# Patient Record
Sex: Female | Born: 1950 | Race: White | Hispanic: No | Smoking: Never smoker
Health system: Southern US, Community
[De-identification: ages and names within clinical notes are randomized; demographics above are authoritative.]

## PROBLEM LIST (undated history)

## (undated) DIAGNOSIS — E039 Hypothyroidism, unspecified: Secondary | ICD-10-CM

## (undated) DIAGNOSIS — J45998 Other asthma: Secondary | ICD-10-CM

## (undated) DIAGNOSIS — M1990 Unspecified osteoarthritis, unspecified site: Secondary | ICD-10-CM

## (undated) DIAGNOSIS — J302 Other seasonal allergic rhinitis: Secondary | ICD-10-CM

## (undated) HISTORY — PX: TONSILLECTOMY: SUR1361

## (undated) HISTORY — PX: DILATION AND CURETTAGE OF UTERUS: SHX78

## (undated) HISTORY — PX: TUBAL LIGATION: SHX77

## (undated) HISTORY — PX: WRIST SURGERY: SHX841

## (undated) HISTORY — PX: MENISCECTOMY: SHX123

---

## 2018-07-24 ENCOUNTER — Encounter (HOSPITAL_COMMUNITY): Payer: Self-pay | Admitting: Student

## 2018-07-24 NOTE — H&P (Signed)
TOTAL HIP ADMISSION H&P  Patient is admitted for right total hip arthroplasty.  Subjective:  Chief Complaint: right hip pain  HPI: Hailey Holmes, 67 y.o. female, has a history of pain and functional disability in the right hip(s) due to arthritis and patient has failed non-surgical conservative treatments for greater than 12 weeks to include corticosteriod injections and activity modification.  Onset of symptoms was gradual starting several years ago with rapidly worsening course since that time due to a fall in February.The patient noted no past surgery on the right hip(s).  Patient currently rates pain in the right hip at 9 out of 10 with activity. Patient has night pain, worsening of pain with activity and weight bearing and limited range of motion. Patient has evidence of bone-on-bone arthritis of the right hip with errosion of the femoral head by imaging studies. This condition presents safety issues increasing the risk of falls. There is no current active infection.  There are no active problems to display for this patient.  History reviewed. No pertinent past medical history.  History reviewed. No pertinent surgical history.  No current facility-administered medications for this encounter.    No current outpatient medications on file.   Allergies not on file  Social History   Tobacco Use  . Smoking status: Not on file  Substance Use Topics  . Alcohol use: Not on file    History reviewed. No pertinent family history.   Review of Systems  Constitutional: Negative for chills and fever.  HENT: Negative for congestion, sore throat and tinnitus.   Eyes: Negative for double vision, photophobia and pain.  Respiratory: Negative for cough, shortness of breath and wheezing.   Cardiovascular: Negative for chest pain, palpitations and orthopnea.  Gastrointestinal: Negative for heartburn, nausea and vomiting.  Genitourinary: Negative for dysuria, frequency and urgency.  Musculoskeletal:  Positive for joint pain.  Neurological: Negative for dizziness, weakness and headaches.  Psychiatric/Behavioral: Negative for depression.    Objective:  Physical Exam  Well nourished and well developed. General: Alert and oriented x3, cooperative and pleasant, no acute distress. Head: normocephalic, atraumatic, neck supple. Eyes: EOMI. Respiratory: breath sounds clear in all fields, no wheezing, rales, or rhonchi. Cardiovascular: Regular rate and rhythm, no murmurs, gallops or rubs.  Abdomen: non-tender to palpation and soft, normoactive bowel sounds. Musculoskeletal: She has a severely antalgic gait pattern and requires 2 walking sticks to ambulate.  Right Hip Exam: ROM: Flexion to 100, Internal Rotation is minimal, External Rotation 30, and abduction 30 with discomfort. There is no tenderness over the greater trochanter bursa. Left Hip Exam: ROM: normal without discomfort. There is no tenderness over the greater trochanter bursa.  Right Knee Exam: No effusion. No Swelling.Range of motion is 0-130 degrees. Moderate crepitus on range of motion of the knee. Tenderness greater in the medial than lateral joint line tenderness. Stable knee. Left Knee Exam: No effusion. No Swelling.Range of motion is 0-125 degrees. No crepitus on range of motion of the knee. No medial joint line tendernessNo lateral joint line tenderness. Stable knee. Calves soft and nontender. Motor function intact in LE. Strength 5/5 LE bilaterally. Neuro: Distal pulses 2+. Sensation to light touch intact in LE.  Vital signs in last 24 hours: Blood pressure: 136/80 mmHg  Labs:   There is no height or weight on file to calculate BMI.   Imaging Review Plain radiographs demonstrate severe degenerative joint disease of the right hip(s). The bone quality appears to be adequate for age and reported activity level.  Preoperative templating of the joint replacement has been completed, documented, and submitted to the  Operating Room personnel in order to optimize intra-operative equipment management.     Assessment/Plan:  End stage arthritis, right hip(s)  The patient history, physical examination, clinical judgement of the provider and imaging studies are consistent with end stage degenerative joint disease of the right hip(s) and total hip arthroplasty is deemed medically necessary. The treatment options including medical management, injection therapy, arthroscopy and arthroplasty were discussed at length. The risks and benefits of total hip arthroplasty were presented and reviewed. The risks due to aseptic loosening, infection, stiffness, dislocation/subluxation,  thromboembolic complications and other imponderables were discussed.  The patient acknowledged the explanation, agreed to proceed with the plan and consent was signed. Patient is being admitted for inpatient treatment for surgery, pain control, PT, OT, prophylactic antibiotics, VTE prophylaxis, progressive ambulation and ADL's and discharge planning.The patient is planning to be discharged home with home health services.  Therapy Plans: HHPT Disposition: Home with friend Planned DVT Prophylaxis: Aspirin 325 mg BID DME needed: 3-n-1 PCP: Dr. Ninetta Lights TXA: IV Allergies: PCN (diarrhea), Codeine (decreased BP) Anesthesia concerns: nausea/vomiting Other: Pt states tramadol has not dropped BP in the past  - Patient was instructed on what medications to stop prior to surgery. - Follow-up visit in 2 weeks with Dr. Lequita Halt - Begin physical therapy following surgery - Pre-operative lab work as pre-surgical testing - Prescriptions will be provided in hospital at time of discharge  Arther Abbott, PA-C Orthopedic Surgery [facility] [place]

## 2018-07-31 ENCOUNTER — Other Ambulatory Visit: Payer: Self-pay

## 2018-07-31 ENCOUNTER — Encounter (HOSPITAL_COMMUNITY)
Admission: RE | Admit: 2018-07-31 | Discharge: 2018-07-31 | Disposition: A | Discharge status: Home / Self Care | Payer: Medicare Other | Source: Ambulatory Visit | Attending: Orthopedic Surgery | Admitting: Orthopedic Surgery

## 2018-07-31 ENCOUNTER — Encounter (HOSPITAL_COMMUNITY): Payer: Self-pay | Admitting: Emergency Medicine

## 2018-07-31 DIAGNOSIS — Z01812 Encounter for preprocedural laboratory examination: Secondary | ICD-10-CM | POA: Diagnosis present

## 2018-07-31 HISTORY — DX: Other seasonal allergic rhinitis: J30.2

## 2018-07-31 HISTORY — DX: Hypothyroidism, unspecified: E03.9

## 2018-07-31 HISTORY — DX: Unspecified osteoarthritis, unspecified site: M19.90

## 2018-07-31 HISTORY — DX: Other asthma: J45.998

## 2018-07-31 LAB — SURGICAL PCR SCREEN
MRSA, PCR: NEGATIVE
Staphylococcus aureus: NEGATIVE

## 2018-07-31 LAB — APTT: aPTT: 27 seconds (ref 24–36)

## 2018-07-31 LAB — PROTIME-INR
INR: 0.88
Prothrombin Time: 11.8 seconds (ref 11.4–15.2)

## 2018-07-31 NOTE — Progress Notes (Signed)
Chart left with tamika RN to f/u

## 2018-07-31 NOTE — Progress Notes (Signed)
Clearance Dr Ninetta LightsSara Furr 07-20-18 on chart

## 2018-07-31 NOTE — Patient Instructions (Signed)
Hailey Holmes  07/31/2018   Your procedure is scheduled on: 08-13-18   Report to [facility] Main  Entrance    Report to admitting at 12:20PM    Call this number if you have problems the morning of surgery [phone]     Remember: Do not eat food After Midnight. YOU MAY HAVE CLEAR LIQUIDS FROM MIDNIGHT UNTIL 8:30AM. NOTHING BY MOUTH AFTER 8:30AM! BRUSH YOUR TEETH MORNING OF SURGERY AND RINSE YOUR MOUTH OUT, NO CHEWING GUM CANDY OR MINTS.      CLEAR LIQUID DIET   Foods Allowed                                                                     Foods Excluded  Coffee and tea, regular and decaf                             liquids that you cannot  Plain Jell-O in any flavor                                             see through such as: Fruit ices (not with fruit pulp)                                     milk, soups, orange juice  Iced Popsicles                                    All solid food Carbonated beverages, regular and diet                                    Cranberry, grape and apple juices Sports drinks like Gatorade Lightly seasoned clear broth or consume(fat free) Sugar, honey syrup  Sample Menu Breakfast                                Lunch                                     Supper Cranberry juice                    Beef broth                            Chicken broth Jell-O                                     Grape juice  Apple juice Coffee or tea                        Jell-O                                      Popsicle                                                Coffee or tea                        Coffee or tea  _____________________________________________________________________       Take these medicines the morning of surgery with A SIP OF WATER: ALLEGRA, LEVOTHYROXINE                                 You may not have any metal on your body including hair pins and              piercings  Do not  wear jewelry, make-up, lotions, powders or perfumes, deodorant             Do not wear nail polish.  Do not shave  48 hours prior to surgery.             Do not bring valuables to the hospital. [facility] IS NOT             RESPONSIBLE   FOR VALUABLES.  Contacts, dentures or bridgework may not be worn into surgery.  Leave suitcase in the car. After surgery it may be brought to your room.                  Please read over the following fact sheets you were given: _____________________________________________________________________             [facility] Pre[facility] Before surgery, you can play an important role.  Because skin is not sterile, your skin needs to be as free of germs as possible.  You can reduce the number of germs on your skin by washing with CHG (chlorahexidine gluconate) soap before surgery.  CHG is an antiseptic cleaner which kills germs and bonds with the skin to continue killing germs even after washing. Please DO NOT use if you have an allergy to CHG or antibacterial soaps.  If your skin becomes reddened/irritated stop using the CHG and inform your nurse when you arrive at Short Stay. Do not shave (including legs and underarms) for at least 48 hours prior to the first CHG shower.  You may shave your face/neck. Please follow these instructions carefully:  1.  Shower with CHG Soap the night before surgery and the  morning of Surgery.  2.  If you choose to wash your hair, wash your hair first as usual with your  normal  shampoo.  3.  After you shampoo, rinse your hair and body thoroughly to remove the  shampoo.                           4.  Use CHG as you would any other liquid soap.  You can  apply chg directly  to the skin and wash                       Gently with a scrungie or clean washcloth.  5.  Apply the CHG Soap to your body ONLY FROM THE NECK DOWN.   Do not use on face/ open                           Wound or open sores. Avoid contact with eyes,  ears mouth and genitals (private parts).                       Wash face,  Genitals (private parts) with your normal soap.             6.  Wash thoroughly, paying special attention to the area where your surgery  will be performed.  7.  Thoroughly rinse your body with warm water from the neck down.  8.  DO NOT shower/wash with your normal soap after using and rinsing off  the CHG Soap.                9.  Pat yourself dry with a clean towel.            10.  Wear clean pajamas.            11.  Place clean sheets on your bed the night of your first shower and do not  sleep with pets. Day of Surgery : Do not apply any lotions/deodorants the morning of surgery.  Please wear clean clothes to the hospital/surgery center.  FAILURE TO FOLLOW THESE INSTRUCTIONS MAY RESULT IN THE CANCELLATION OF YOUR SURGERY PATIENT SIGNATURE_________________________________  NURSE SIGNATURE__________________________________  ________________________________________________________________________   Hailey Holmes  An incentive spirometer is a tool that can help keep your lungs clear and active. This tool measures how well you are filling your lungs with each breath. Taking long deep breaths may help reverse or decrease the chance of developing breathing (pulmonary) problems (especially infection) following:  A long period of time when you are unable to move or be active. BEFORE THE PROCEDURE   If the spirometer includes an indicator to show your best effort, your nurse or respiratory therapist will set it to a desired goal.  If possible, sit up straight or lean slightly forward. Try not to slouch.  Hold the incentive spirometer in an upright position. INSTRUCTIONS FOR USE  1. Sit on the edge of your bed if possible, or sit up as far as you can in bed or on a chair. 2. Hold the incentive spirometer in an upright position. 3. Breathe out normally. 4. Place the mouthpiece in your mouth and seal your lips  tightly around it. 5. Breathe in slowly and as deeply as possible, raising the piston or the ball toward the top of the column. 6. Hold your breath for 3-5 seconds or for as long as possible. Allow the piston or ball to fall to the bottom of the column. 7. Remove the mouthpiece from your mouth and breathe out normally. 8. Rest for a few seconds and repeat Steps 1 through 7 at least 10 times every 1-2 hours when you are awake. Take your time and take a few normal breaths between deep breaths. 9. The spirometer may include an indicator to show your best effort. Use the indicator as a goal to work toward during  each repetition. 10. After each set of 10 deep breaths, practice coughing to be sure your lungs are clear. If you have an incision (the cut made at the time of surgery), support your incision when coughing by placing a pillow or rolled up towels firmly against it. Once you are able to get out of bed, walk around indoors and cough well. You may stop using the incentive spirometer when instructed by your caregiver.  RISKS AND COMPLICATIONS  Take your time so you do not get dizzy or light-headed.  If you are in pain, you may need to take or ask for pain medication before doing incentive spirometry. It is harder to take a deep breath if you are having pain. AFTER USE  Rest and breathe slowly and easily.  It can be helpful to keep track of a log of your progress. Your caregiver can provide you with a simple table to help with this. If you are using the spirometer at home, follow these instructions: [place] IF:   You are having difficultly using the spirometer.  You have trouble using the spirometer as often as instructed.  Your pain medication is not giving enough relief while using the spirometer.  You develop fever of 100.5 F (38.1 C) or higher. SEEK IMMEDIATE MEDICAL CARE IF:   You cough up bloody sputum that had not been present before.  You develop fever of 102 F  (38.9 C) or greater.  You develop worsening pain at or near the incision site. MAKE SURE YOU:   Understand these instructions.  Will watch your condition.  Will get help right away if you are not doing well or get worse. Document Released: 03/13/2007 Document Revised: 01/23/2012 Document Reviewed: 05/14/2007 Exit[employer] Patient Information 2014 [employer], [place].   ________________________________________________________________________  WHAT IS A BLOOD TRANSFUSION? Blood Transfusion Information  A transfusion is the replacement of blood or some of its parts. Blood is made up of multiple cells which provide different functions.  Red blood cells carry oxygen and are used for blood loss replacement.  White blood cells fight against infection.  Platelets control bleeding.  Plasma helps clot blood.  Other blood products are available for specialized needs, such as hemophilia or other clotting disorders. BEFORE THE TRANSFUSION  Who gives blood for transfusions?   Healthy volunteers who are fully evaluated to make sure their blood is safe. This is blood bank blood. Transfusion therapy is the safest it has ever been in the practice of medicine. Before blood is taken from a donor, a complete history is taken to make sure that person has no history of diseases nor engages in risky social behavior (examples are intravenous drug use or sexual activity with multiple partners). The donor's travel history is screened to minimize risk of transmitting infections, such as malaria. The donated blood is tested for signs of infectious diseases, such as HIV and hepatitis. The blood is then tested to be sure it is compatible with you in order to minimize the chance of a transfusion reaction. If you or a relative donates blood, this is often done in anticipation of surgery and is not appropriate for emergency situations. It takes many days to process the donated blood. RISKS AND COMPLICATIONS Although  transfusion therapy is very safe and saves many lives, the main dangers of transfusion include:   Getting an infectious disease.  Developing a transfusion reaction. This is an allergic reaction to something in the blood you were given. Every precaution is taken to prevent  this. The decision to have a blood transfusion has been considered carefully by your caregiver before blood is given. Blood is not given unless the benefits outweigh the risks. AFTER THE TRANSFUSION  Right after receiving a blood transfusion, you will usually feel much better and more energetic. This is especially true if your red blood cells have gotten low (anemic). The transfusion raises the level of the red blood cells which carry oxygen, and this usually causes an energy increase.  The nurse administering the transfusion will monitor you carefully for complications. HOME CARE INSTRUCTIONS  No special instructions are needed after a transfusion. You may find your energy is better. Speak with your caregiver about any limitations on activity for underlying diseases you may have. SEEK MEDICAL CARE IF:   Your condition is not improving after your transfusion.  You develop redness or irritation at the intravenous (IV) site. SEEK IMMEDIATE MEDICAL CARE IF:  Any of the following symptoms occur over the next 12 hours:  Shaking chills.  You have a temperature by mouth above 102 F (38.9 C), not controlled by medicine.  Chest, back, or muscle pain.  People around you feel you are not acting correctly or are confused.  Shortness of breath or difficulty breathing.  Dizziness and fainting.  You get a rash or develop hives.  You have a decrease in urine output.  Your urine turns a dark color or changes to pink, red, or brown. Any of the following symptoms occur over the next 10 days:  You have a temperature by mouth above 102 F (38.9 C), not controlled by medicine.  Shortness of breath.  Weakness after normal  activity.  The white part of the eye turns yellow (jaundice).  You have a decrease in the amount of urine or are urinating less often.  Your urine turns a dark color or changes to pink, red, or brown. Document Released: 10/28/2000 Document Revised: 01/23/2012 Document Reviewed: 06/16/2008 [facility] Patient Information 2014 [place], [place].  _______________________________________________________________________

## 2018-08-06 NOTE — Progress Notes (Signed)
Patient aware of surgery time change. Patient to arrive to check in at admitting at 11:15am. Npo solids after midnight , okay to have clear liquids until 0745. Total npo after 0745. Patient verbalized understanding

## 2018-08-13 ENCOUNTER — Inpatient Hospital Stay (HOSPITAL_COMMUNITY): Payer: Medicare Other

## 2018-08-13 ENCOUNTER — Inpatient Hospital Stay (HOSPITAL_COMMUNITY): Payer: Medicare Other | Admitting: Anesthesiology

## 2018-08-13 ENCOUNTER — Encounter (HOSPITAL_COMMUNITY)
Admission: RE | Disposition: A | Discharge status: Home Health | Payer: Self-pay | Source: Home / Self Care | Attending: Orthopedic Surgery

## 2018-08-13 ENCOUNTER — Other Ambulatory Visit: Payer: Self-pay

## 2018-08-13 ENCOUNTER — Encounter (HOSPITAL_COMMUNITY): Payer: Self-pay | Admitting: *Deleted

## 2018-08-13 ENCOUNTER — Inpatient Hospital Stay (HOSPITAL_COMMUNITY)
Admission: RE | Admit: 2018-08-13 | Discharge: 2018-08-16 | DRG: 470 | Disposition: A | Discharge status: Home Health | Payer: Medicare Other | Attending: Orthopedic Surgery | Admitting: Orthopedic Surgery

## 2018-08-13 DIAGNOSIS — Z79890 Hormone replacement therapy (postmenopausal): Secondary | ICD-10-CM | POA: Diagnosis not present

## 2018-08-13 DIAGNOSIS — M1611 Unilateral primary osteoarthritis, right hip: Secondary | ICD-10-CM | POA: Diagnosis present

## 2018-08-13 DIAGNOSIS — E039 Hypothyroidism, unspecified: Secondary | ICD-10-CM | POA: Diagnosis present

## 2018-08-13 DIAGNOSIS — M169 Osteoarthritis of hip, unspecified: Secondary | ICD-10-CM | POA: Diagnosis present

## 2018-08-13 DIAGNOSIS — Z9181 History of falling: Secondary | ICD-10-CM | POA: Diagnosis not present

## 2018-08-13 DIAGNOSIS — K5900 Constipation, unspecified: Secondary | ICD-10-CM | POA: Diagnosis present

## 2018-08-13 DIAGNOSIS — Z419 Encounter for procedure for purposes other than remedying health state, unspecified: Secondary | ICD-10-CM

## 2018-08-13 DIAGNOSIS — M25751 Osteophyte, right hip: Secondary | ICD-10-CM | POA: Diagnosis present

## 2018-08-13 DIAGNOSIS — J302 Other seasonal allergic rhinitis: Secondary | ICD-10-CM | POA: Diagnosis present

## 2018-08-13 DIAGNOSIS — Z96649 Presence of unspecified artificial hip joint: Secondary | ICD-10-CM

## 2018-08-13 HISTORY — PX: TOTAL HIP ARTHROPLASTY: SHX124

## 2018-08-13 LAB — TYPE AND SCREEN
ABO/RH(D): A POS
Antibody Screen: NEGATIVE

## 2018-08-13 LAB — ABO/RH: ABO/RH(D): A POS

## 2018-08-13 SURGERY — ARTHROPLASTY, HIP, TOTAL, ANTERIOR APPROACH
Anesthesia: Monitor Anesthesia Care | Site: Hip | Laterality: Right

## 2018-08-13 MED ORDER — FLEET ENEMA 7-19 GM/118ML RE ENEM: 1.0000 | ENEMA | Freq: Once | RECTAL | Status: DC | PRN

## 2018-08-13 MED ORDER — ONDANSETRON HCL 4 MG/2ML IJ SOLN
4.0000 mg | Freq: Four times a day (QID) | INTRAMUSCULAR | Status: DC | PRN
  Administered 2018-08-14: 4 mg via INTRAVENOUS
  Filled 2018-08-13: qty 2

## 2018-08-13 MED ORDER — PHENYLEPHRINE HCL 10 MG/ML IJ SOLN
INTRAMUSCULAR | Status: AC
  Filled 2018-08-13: qty 1

## 2018-08-13 MED ORDER — POLYETHYLENE GLYCOL 3350 17 G PO PACK
17.0000 g | PACK | Freq: Every day | ORAL | Status: DC | PRN
  Administered 2018-08-16: 17 g via ORAL
  Filled 2018-08-13: qty 1

## 2018-08-13 MED ORDER — SODIUM CHLORIDE 0.9 % IV SOLN
INTRAVENOUS | Status: DC
  Administered 2018-08-13 – 2018-08-14 (×2): via INTRAVENOUS

## 2018-08-13 MED ORDER — METOCLOPRAMIDE HCL 5 MG PO TABS: 5.0000 mg | ORAL_TABLET | Freq: Three times a day (TID) | ORAL | Status: DC | PRN

## 2018-08-13 MED ORDER — TRAMADOL HCL 50 MG PO TABS
50.0000 mg | ORAL_TABLET | Freq: Four times a day (QID) | ORAL | Status: DC | PRN
  Administered 2018-08-13 – 2018-08-14 (×2): 100 mg via ORAL
  Filled 2018-08-13 (×2): qty 2

## 2018-08-13 MED ORDER — ACETAMINOPHEN 10 MG/ML IV SOLN
1000.0000 mg | Freq: Four times a day (QID) | INTRAVENOUS | Status: DC
  Administered 2018-08-13: 1000 mg via INTRAVENOUS
  Filled 2018-08-13: qty 100

## 2018-08-13 MED ORDER — DOCUSATE SODIUM 100 MG PO CAPS
100.0000 mg | ORAL_CAPSULE | Freq: Two times a day (BID) | ORAL | Status: DC
  Administered 2018-08-13 – 2018-08-16 (×6): 100 mg via ORAL
  Filled 2018-08-13 (×6): qty 1

## 2018-08-13 MED ORDER — TRANEXAMIC ACID 1000 MG/10ML IV SOLN
1000.0000 mg | Freq: Once | INTRAVENOUS | Status: AC
  Administered 2018-08-13: 1000 mg via INTRAVENOUS
  Filled 2018-08-13: qty 1000

## 2018-08-13 MED ORDER — PROPOFOL 500 MG/50ML IV EMUL
INTRAVENOUS | Status: DC | PRN
  Administered 2018-08-13: 75 ug/kg/min via INTRAVENOUS

## 2018-08-13 MED ORDER — BUPIVACAINE-EPINEPHRINE (PF) 0.25% -1:200000 IJ SOLN
INTRAMUSCULAR | Status: DC | PRN
  Administered 2018-08-13: 30 mL via PERINEURAL

## 2018-08-13 MED ORDER — LIP MEDEX EX OINT
TOPICAL_OINTMENT | CUTANEOUS | Status: AC
  Filled 2018-08-13: qty 7

## 2018-08-13 MED ORDER — METOCLOPRAMIDE HCL 5 MG/ML IJ SOLN: 5.0000 mg | Freq: Three times a day (TID) | INTRAMUSCULAR | Status: DC | PRN

## 2018-08-13 MED ORDER — LEVOTHYROXINE SODIUM 100 MCG PO TABS
100.0000 ug | ORAL_TABLET | Freq: Every day | ORAL | Status: DC
  Administered 2018-08-14 – 2018-08-16 (×3): 100 ug via ORAL
  Filled 2018-08-13 (×3): qty 1

## 2018-08-13 MED ORDER — CEFAZOLIN SODIUM-DEXTROSE 2-4 GM/100ML-% IV SOLN
2.0000 g | Freq: Four times a day (QID) | INTRAVENOUS | Status: AC
  Administered 2018-08-13 (×2): 2 g via INTRAVENOUS
  Filled 2018-08-13 (×2): qty 100

## 2018-08-13 MED ORDER — METHOCARBAMOL 500 MG PO TABS
500.0000 mg | ORAL_TABLET | Freq: Four times a day (QID) | ORAL | Status: DC | PRN
  Administered 2018-08-13 – 2018-08-16 (×9): 500 mg via ORAL
  Filled 2018-08-13 (×10): qty 1

## 2018-08-13 MED ORDER — CHLORHEXIDINE GLUCONATE 4 % EX LIQD: 60.0000 mL | Freq: Once | CUTANEOUS | Status: DC

## 2018-08-13 MED ORDER — BUPIVACAINE IN DEXTROSE 0.75-8.25 % IT SOLN
INTRATHECAL | Status: DC | PRN
  Administered 2018-08-13: 2 mL via INTRATHECAL

## 2018-08-13 MED ORDER — HYDROMORPHONE BOLUS VIA INFUSION: 0.5000 mg | INTRAVENOUS | Status: DC | PRN

## 2018-08-13 MED ORDER — HYDROMORPHONE HCL 2 MG PO TABS
2.0000 mg | ORAL_TABLET | ORAL | Status: DC | PRN
  Administered 2018-08-14 – 2018-08-16 (×8): 2 mg via ORAL
  Filled 2018-08-13 (×8): qty 1

## 2018-08-13 MED ORDER — ONDANSETRON HCL 4 MG/2ML IJ SOLN
INTRAMUSCULAR | Status: DC | PRN
  Administered 2018-08-13: 4 mg via INTRAVENOUS

## 2018-08-13 MED ORDER — TRANEXAMIC ACID 1000 MG/10ML IV SOLN
1000.0000 mg | INTRAVENOUS | Status: AC
  Administered 2018-08-13: 1000 mg via INTRAVENOUS
  Filled 2018-08-13: qty 10

## 2018-08-13 MED ORDER — HYDROMORPHONE HCL 2 MG PO TABS
4.0000 mg | ORAL_TABLET | ORAL | Status: DC | PRN
  Administered 2018-08-14 – 2018-08-15 (×5): 4 mg via ORAL
  Filled 2018-08-13 (×5): qty 2

## 2018-08-13 MED ORDER — MIDAZOLAM HCL 5 MG/5ML IJ SOLN
INTRAMUSCULAR | Status: DC | PRN
  Administered 2018-08-13: 0.5 mg via INTRAVENOUS
  Administered 2018-08-13: 2 mg via INTRAVENOUS
  Administered 2018-08-13: 1 mg via INTRAVENOUS
  Administered 2018-08-13: 0.5 mg via INTRAVENOUS

## 2018-08-13 MED ORDER — MIDAZOLAM HCL 2 MG/2ML IJ SOLN
INTRAMUSCULAR | Status: AC
  Filled 2018-08-13: qty 2

## 2018-08-13 MED ORDER — SCOPOLAMINE 1 MG/3DAYS TD PT72
MEDICATED_PATCH | TRANSDERMAL | Status: DC | PRN
  Administered 2018-08-13: 1 via TRANSDERMAL

## 2018-08-13 MED ORDER — HYDROMORPHONE HCL 1 MG/ML IJ SOLN
0.5000 mg | INTRAMUSCULAR | Status: DC | PRN
  Administered 2018-08-14: 0.5 mg via INTRAVENOUS
  Filled 2018-08-13: qty 0.5

## 2018-08-13 MED ORDER — METHOCARBAMOL 500 MG IVPB - SIMPLE MED
INTRAVENOUS | Status: AC
  Administered 2018-08-13: 500 mg via INTRAVENOUS
  Filled 2018-08-13: qty 50

## 2018-08-13 MED ORDER — BUPIVACAINE-EPINEPHRINE (PF) 0.25% -1:200000 IJ SOLN
INTRAMUSCULAR | Status: AC
  Filled 2018-08-13: qty 30

## 2018-08-13 MED ORDER — METHOCARBAMOL 500 MG IVPB - SIMPLE MED
500.0000 mg | Freq: Four times a day (QID) | INTRAVENOUS | Status: DC | PRN
  Administered 2018-08-13: 500 mg via INTRAVENOUS
  Filled 2018-08-13: qty 50

## 2018-08-13 MED ORDER — DEXAMETHASONE SODIUM PHOSPHATE 10 MG/ML IJ SOLN
10.0000 mg | Freq: Once | INTRAMUSCULAR | Status: AC
  Administered 2018-08-14: 10 mg via INTRAVENOUS
  Filled 2018-08-13: qty 1

## 2018-08-13 MED ORDER — DEXAMETHASONE SODIUM PHOSPHATE 10 MG/ML IJ SOLN
8.0000 mg | Freq: Once | INTRAMUSCULAR | Status: AC
  Administered 2018-08-13: 8 mg via INTRAVENOUS

## 2018-08-13 MED ORDER — LACTATED RINGERS IV SOLN
INTRAVENOUS | Status: DC
  Administered 2018-08-13 (×2): via INTRAVENOUS

## 2018-08-13 MED ORDER — ONDANSETRON HCL 4 MG PO TABS: 4.0000 mg | ORAL_TABLET | Freq: Four times a day (QID) | ORAL | Status: DC | PRN

## 2018-08-13 MED ORDER — MENTHOL 3 MG MT LOZG: 1.0000 | LOZENGE | OROMUCOSAL | Status: DC | PRN

## 2018-08-13 MED ORDER — SODIUM CHLORIDE 0.9 % IV SOLN
INTRAVENOUS | Status: DC | PRN
  Administered 2018-08-13: 25 ug/min via INTRAVENOUS

## 2018-08-13 MED ORDER — CEFAZOLIN SODIUM-DEXTROSE 2-4 GM/100ML-% IV SOLN
2.0000 g | INTRAVENOUS | Status: AC
  Administered 2018-08-13: 2 g via INTRAVENOUS
  Filled 2018-08-13: qty 100

## 2018-08-13 MED ORDER — LORATADINE 10 MG PO TABS
10.0000 mg | ORAL_TABLET | Freq: Every day | ORAL | Status: DC
  Administered 2018-08-14 – 2018-08-16 (×3): 10 mg via ORAL
  Filled 2018-08-13 (×3): qty 1

## 2018-08-13 MED ORDER — DIPHENHYDRAMINE HCL 12.5 MG/5ML PO ELIX: 12.5000 mg | ORAL_SOLUTION | ORAL | Status: DC | PRN

## 2018-08-13 MED ORDER — PROPOFOL 10 MG/ML IV BOLUS
INTRAVENOUS | Status: AC
  Filled 2018-08-13: qty 20

## 2018-08-13 MED ORDER — SODIUM CHLORIDE 0.9 % IR SOLN
Status: DC | PRN
  Administered 2018-08-13: 1000 mL

## 2018-08-13 MED ORDER — STERILE WATER FOR IRRIGATION IR SOLN
Status: DC | PRN
  Administered 2018-08-13: 1000 mL

## 2018-08-13 MED ORDER — ASPIRIN EC 325 MG PO TBEC
325.0000 mg | DELAYED_RELEASE_TABLET | Freq: Two times a day (BID) | ORAL | Status: DC
  Administered 2018-08-14 – 2018-08-16 (×5): 325 mg via ORAL
  Filled 2018-08-13 (×5): qty 1

## 2018-08-13 MED ORDER — ACETAMINOPHEN 500 MG PO TABS
500.0000 mg | ORAL_TABLET | Freq: Four times a day (QID) | ORAL | Status: AC
  Administered 2018-08-13 – 2018-08-14 (×4): 500 mg via ORAL
  Filled 2018-08-13 (×4): qty 1

## 2018-08-13 MED ORDER — PROPOFOL 10 MG/ML IV BOLUS
INTRAVENOUS | Status: AC
  Filled 2018-08-13: qty 40

## 2018-08-13 MED ORDER — SCOPOLAMINE 1 MG/3DAYS TD PT72
MEDICATED_PATCH | TRANSDERMAL | Status: AC
  Filled 2018-08-13: qty 1

## 2018-08-13 MED ORDER — FENTANYL CITRATE (PF) 100 MCG/2ML IJ SOLN: 25.0000 ug | INTRAMUSCULAR | Status: DC | PRN

## 2018-08-13 MED ORDER — BISACODYL 10 MG RE SUPP: 10.0000 mg | Freq: Every day | RECTAL | Status: DC | PRN

## 2018-08-13 MED ORDER — PHENOL 1.4 % MT LIQD
1.0000 | OROMUCOSAL | Status: DC | PRN
  Filled 2018-08-13: qty 177

## 2018-08-13 SURGICAL SUPPLY — 43 items
BAG DECANTER FOR FLEXI CONT (MISCELLANEOUS) IMPLANT
BAG ZIPLOCK 12X15 (MISCELLANEOUS) IMPLANT
BALL HIP CERAMIC (Hips) ×1 IMPLANT
BLADE SAG 18X100X1.27 (BLADE) ×3 IMPLANT
CLOSURE WOUND 1/2 X4 (GAUZE/BANDAGES/DRESSINGS) ×1
COVER PERINEAL POST (MISCELLANEOUS) ×3 IMPLANT
COVER SURGICAL LIGHT HANDLE (MISCELLANEOUS) ×3 IMPLANT
CUP ACET PINNACLE SECTR 50MM (Hips) ×1 IMPLANT
DECANTER SPIKE VIAL GLASS SM (MISCELLANEOUS) ×3 IMPLANT
DRAPE STERI IOBAN 125X83 (DRAPES) ×3 IMPLANT
DRAPE U-SHAPE 47X51 STRL (DRAPES) ×6 IMPLANT
DRSG ADAPTIC 3X8 NADH LF (GAUZE/BANDAGES/DRESSINGS) ×3 IMPLANT
DRSG EMULSION OIL 3X16 NADH (GAUZE/BANDAGES/DRESSINGS) ×3 IMPLANT
DRSG MEPILEX BORDER 4X4 (GAUZE/BANDAGES/DRESSINGS) ×3 IMPLANT
DRSG MEPILEX BORDER 4X8 (GAUZE/BANDAGES/DRESSINGS) ×3 IMPLANT
DURAPREP 26ML APPLICATOR (WOUND CARE) ×3 IMPLANT
ELECT REM PT RETURN 15FT ADLT (MISCELLANEOUS) ×3 IMPLANT
EVACUATOR 1/8 PVC DRAIN (DRAIN) ×3 IMPLANT
GLOVE BIO SURGEON STRL SZ7 (GLOVE) ×3 IMPLANT
GLOVE BIO SURGEON STRL SZ8 (GLOVE) ×3 IMPLANT
GLOVE BIOGEL PI IND STRL 7.0 (GLOVE) ×1 IMPLANT
GLOVE BIOGEL PI IND STRL 8 (GLOVE) ×1 IMPLANT
GLOVE BIOGEL PI INDICATOR 7.0 (GLOVE) ×2
GLOVE BIOGEL PI INDICATOR 8 (GLOVE) ×2
GOWN STRL REUS W/TWL LRG LVL3 (GOWN DISPOSABLE) ×3 IMPLANT
GOWN STRL REUS W/TWL XL LVL3 (GOWN DISPOSABLE) ×3 IMPLANT
HIP BALL CERAMIC (Hips) ×3 IMPLANT
HOLDER FOLEY CATH W/STRAP (MISCELLANEOUS) ×3 IMPLANT
LINER MARATHON 32 50 (Hips) ×3 IMPLANT
MANIFOLD NEPTUNE II (INSTRUMENTS) ×3 IMPLANT
PACK ANTERIOR HIP CUSTOM (KITS) ×3 IMPLANT
PINNACLE SECTOR CUP 50MM (Hips) ×3 IMPLANT
STEM FEMORAL SZ 5MM STD ACTIS (Stem) ×3 IMPLANT
STRIP CLOSURE SKIN 1/2X4 (GAUZE/BANDAGES/DRESSINGS) ×2 IMPLANT
SUT ETHIBOND NAB CT1 #1 30IN (SUTURE) ×3 IMPLANT
SUT MNCRL AB 4-0 PS2 18 (SUTURE) ×3 IMPLANT
SUT STRATAFIX 0 PDS 27 VIOLET (SUTURE) ×3
SUT VIC AB 2-0 CT1 27 (SUTURE) ×4
SUT VIC AB 2-0 CT1 TAPERPNT 27 (SUTURE) ×2 IMPLANT
SUTURE STRATFX 0 PDS 27 VIOLET (SUTURE) ×1 IMPLANT
SYR 50ML LL SCALE MARK (SYRINGE) IMPLANT
TRAY FOLEY MTR SLVR 16FR STAT (SET/KITS/TRAYS/PACK) ×3 IMPLANT
YANKAUER SUCT BULB TIP 10FT TU (MISCELLANEOUS) ×3 IMPLANT

## 2018-08-13 NOTE — Anesthesia Procedure Notes (Signed)
Date/Time: 08/13/2018 12:39 PM Performed by: Thornell Mule, CRNA Oxygen Delivery Method: Simple face mask

## 2018-08-13 NOTE — Transfer of Care (Signed)
Immediate Anesthesia Transfer of Care Note  Patient: Hailey Holmes  Procedure(s) Performed: RIGHT TOTAL HIP ARTHROPLASTY ANTERIOR APPROACH (Right Hip)  Patient Location: PACU  Anesthesia Type:MAC and Spinal  Level of Consciousness: awake, alert  and oriented  Airway & Oxygen Therapy: Patient Spontanous Breathing and Patient connected to face mask oxygen  Post-op Assessment: Report given to RN and Post -op Vital signs reviewed and stable  Post vital signs: Reviewed and stable  Last Vitals:  Vitals Value Taken Time  BP 95/59 08/13/2018  2:07 PM  Temp    Pulse 72 08/13/2018  2:08 PM  Resp 16 08/13/2018  2:08 PM  SpO2 94 % 08/13/2018  2:08 PM  Vitals shown include unvalidated device data.  Last Pain:  Vitals:   08/13/18 1054  TempSrc:   PainSc: 8       Patients Stated Pain Goal: 4 (63/84/66 [id number])  Complications: No apparent anesthesia complications

## 2018-08-13 NOTE — Anesthesia Postprocedure Evaluation (Signed)
Anesthesia Post Note  Patient: Hailey Holmes  Procedure(s) Performed: RIGHT TOTAL HIP ARTHROPLASTY ANTERIOR APPROACH (Right Hip)     Patient location during evaluation: PACU Anesthesia Type: MAC and Spinal Level of consciousness: awake and alert Pain management: pain level controlled Vital Signs Assessment: post-procedure vital signs reviewed and stable Respiratory status: spontaneous breathing and respiratory function stable Cardiovascular status: blood pressure returned to baseline and stable Postop Assessment: spinal receding Anesthetic complications: no    Last Vitals:  Vitals:   08/13/18 1530 08/13/18 1553  BP: (!) 149/83 (!) 146/93  Pulse: 83 87  Resp: 15 17  Temp: 36.5 C 36.8 C  SpO2: 100% 100%    Last Pain:  Vitals:   08/13/18 1553  TempSrc:   PainSc: 1                  Tiajuana Amass

## 2018-08-13 NOTE — Interval H&P Note (Signed)
History and Physical Interval Note:  08/13/2018 10:34 AM  Hailey Holmes  has presented today for surgery, with the diagnosis of right hip osteoarthritis  The various methods of treatment have been discussed with the patient and family. After consideration of risks, benefits and other options for treatment, the patient has consented to  Procedure(s) with comments: RIGHT TOTAL HIP ARTHROPLASTY ANTERIOR APPROACH (Right) - as a surgical intervention .  The patient's history has been reviewed, patient examined, no change in status, stable for surgery.  I have reviewed the patient's chart and labs.  Questions were answered to the patient's satisfaction.     Homero Fellers Hailey Holmes

## 2018-08-13 NOTE — Anesthesia Preprocedure Evaluation (Addendum)
Anesthesia Evaluation  Patient identified by MRN, date of birth, ID band Patient awake    Reviewed: Allergy & Precautions, NPO status , Patient's Chart, lab work & pertinent test results  Airway Mallampati: II  TM Distance: >3 FB Neck ROM: Full    Dental  (+) Dental Advisory Given   Pulmonary asthma ,    breath sounds clear to auscultation       Cardiovascular negative cardio ROS   Rhythm:Regular Rate:Normal     Neuro/Psych negative neurological ROS     GI/Hepatic negative GI ROS, Neg liver ROS,   Endo/Other  Hypothyroidism   Renal/GU negative Renal ROS     Musculoskeletal  (+) Arthritis ,   Abdominal   Peds  Hematology negative hematology ROS (+)   Anesthesia Other Findings   Reproductive/Obstetrics                            No results found for: WBC, HGB, HCT, MCV, PLT No results found for: CREATININE, BUN, NA, K, CL, CO2 Lab Results  Component Value Date   INR 0.88 07/31/2018    Anesthesia Physical Anesthesia Plan  ASA: II  Anesthesia Plan: MAC and Spinal   Post-op Pain Management:    Induction: Intravenous  PONV Risk Score and Plan: 2 and Ondansetron, Propofol infusion and Treatment may vary due to age or medical condition  Airway Management Planned: Natural Airway and Simple Face Mask  Additional Equipment:   Intra-op Plan:   Post-operative Plan:   Informed Consent: I have reviewed the patients History and Physical, chart, labs and discussed the procedure including the risks, benefits and alternatives for the proposed anesthesia with the patient or authorized representative who has indicated his/her understanding and acceptance.     Plan Discussed with: CRNA  Anesthesia Plan Comments:         Anesthesia Quick Evaluation

## 2018-08-13 NOTE — Op Note (Signed)
OPERATIVE REPORT- TOTAL HIP ARTHROPLASTY   PREOPERATIVE DIAGNOSIS: Osteoarthritis of the Right hip.   POSTOPERATIVE DIAGNOSIS: Osteoarthritis of the Right  hip.   PROCEDURE: Right total hip arthroplasty, anterior approach.   SURGEON: Ollen Gross, MD   ASSISTANT: Dimitri Ped, PA-C  ANESTHESIA:  Spinal  ESTIMATED BLOOD LOSS:-300 mL    DRAINS: Hemovac x1.   COMPLICATIONS: None   CONDITION: PACU - hemodynamically stable.   BRIEF CLINICAL NOTE: Hailey Holmes is a 67 y.o. female who has advanced end-  stage arthritis of their Right  hip with progressively worsening pain and  dysfunction.The patient has failed nonoperative management and presents for  total hip arthroplasty.   PROCEDURE IN DETAIL: After successful administration of spinal  anesthetic, the traction boots for the [facility] bed were placed on both  feet and the patient was placed onto the [facility] bed, boots placed into the leg  holders. The Right hip was then isolated from the perineum with plastic  drapes and prepped and draped in the usual sterile fashion. ASIS and  greater trochanter were marked and a oblique incision was made, starting  at about 1 cm lateral and 2 cm distal to the ASIS and coursing towards  the anterior cortex of the femur. The skin was cut with a 10 blade  through subcutaneous tissue to the level of the fascia overlying the  tensor fascia lata muscle. The fascia was then incised in line with the  incision at the junction of the anterior third and posterior 2/3rd. The  muscle was teased off the fascia and then the interval between the TFL  and the rectus was developed. The Hohmann retractor was then placed at  the top of the femoral neck over the capsule. The vessels overlying the  capsule were cauterized and the fat on top of the capsule was removed.  A Hohmann retractor was then placed anterior underneath the rectus  femoris to give exposure to the entire anterior capsule. A T-shaped   capsulotomy was performed. The edges were tagged and the femoral head  was identified.       Osteophytes are removed off the superior acetabulum.  The femoral neck was then cut in situ with an oscillating saw. Traction  was then applied to the left lower extremity utilizing the [facility]  traction. The femoral head was then removed. Retractors were placed  around the acetabulum and then circumferential removal of the labrum was  performed. Osteophytes were also removed. Reaming starts at 47 mm to  medialize and  Increased in 2 mm increments to 49 mm. We reamed in  approximately 40 degrees of abduction, 20 degrees anteversion. A 50 mm  pinnacle acetabular shell was then impacted in anatomic position under  fluoroscopic guidance with excellent purchase. We did not need to place  any additional dome screws. A 32 mm neutral + 4 marathon liner was then  placed into the acetabular shell.       The femoral lift was then placed along the lateral aspect of the femur  just distal to the vastus ridge. The leg was  externally rotated and capsule  was stripped off the inferior aspect of the femoral neck down to the  level of the lesser trochanter, this was done with electrocautery. The femur was lifted after this was performed. The  leg was then placed in an extended and adducted position essentially delivering the femur. We also removed the capsule superiorly and the piriformis from the piriformis fossa  to gain excellent exposure of the  proximal femur. Rongeur was used to remove some cancellous bone to get  into the lateral portion of the proximal femur for placement of the  initial starter reamer. The starter broaches was placed  the starter broach  and was shown to go down the center of the canal. Broaching  with the Actis system was then performed starting at size 0  coursing  Up to size 5. A size 5 had excellent torsional and rotational  and axial stability. The trial standard offset neck was then  placed  with a 32 + 5 trial head. The hip was then reduced. We confirmed that  the stem was in the canal both on AP and lateral x-rays. It also has excellent sizing. The hip was reduced with outstanding stability through full extension and full external rotation.. AP pelvis was taken and the leg lengths were measured and found to be equal. Hip was then dislocated again and the femoral head and neck removed. The  femoral broach was removed. Size 5 Actis stem with a standard offset  neck was then impacted into the femur following native anteversion. Has  excellent purchase in the canal. Excellent torsional and rotational and  axial stability. It is confirmed to be in the canal on AP and lateral  fluoroscopic views. The 32 + 5 ceramic head was placed and the hip  reduced with outstanding stability. Again AP pelvis was taken and it  confirmed that the leg lengths were equal. The wound was then copiously  irrigated with saline solution and the capsule reattached and repaired  with Ethibond suture. 30 ml of .25% Bupivicaine was  injected into the capsule and into the edge of the tensor fascia lata as well as subcutaneous tissue. The fascia overlying the tensor fascia lata was then closed with a running #1 V-Loc. Subcu was closed with interrupted 2-0 Vicryl and subcuticular running 4-0 Monocryl. Incision was cleaned  and dried. Steri-Strips and a bulky sterile dressing applied. Hemovac  drain was hooked to suction and then the patient was awakened and transported to  recovery in stable condition.        Please note that a surgical assistant was a medical necessity for this procedure to perform it in a safe and expeditious manner. Assistant was necessary to provide appropriate retraction of vital neurovascular structures and to prevent femoral fracture and allow for anatomic placement of the prosthesis.  Gaynelle Arabian, M.D.

## 2018-08-13 NOTE — Discharge Instructions (Signed)
°Dr. Frank Aluisio °Total Joint Specialist °[facility] °[address] °[place], [place] [address] °[phone] ° °ANTERIOR APPROACH TOTAL HIP REPLACEMENT POSTOPERATIVE DIRECTIONS ° ° °Hip Rehabilitation, Guidelines Following Surgery  °The results of a hip operation are greatly improved after range of motion and muscle strengthening exercises. Follow all safety measures which are given to protect your hip. If any of these exercises cause increased pain or swelling in your joint, decrease the amount until you are comfortable again. Then slowly increase the exercises. Call your caregiver if you have problems or questions.  ° °HOME CARE INSTRUCTIONS  °• Remove items at home which could result in a fall. This includes throw rugs or furniture in walking pathways.  °· ICE to the affected hip every three hours for 30 minutes at a time and then as needed for pain and swelling.  Continue to use ice on the hip for pain and swelling from surgery. You may notice swelling that will progress down to the foot and ankle.  This is normal after surgery.  Elevate the leg when you are not up walking on it.   °· Continue to use the breathing machine which will help keep your temperature down.  It is common for your temperature to cycle up and down following surgery, especially at night when you are not up moving around and exerting yourself.  The breathing machine keeps your lungs expanded and your temperature down. ° °DIET °You may resume your previous home diet once your are discharged from the hospital. ° °DRESSING / WOUND CARE / SHOWERING °You may shower 3 days after surgery, but keep the wounds dry during showering.  You may use an occlusive plastic wrap (Press'n Seal for example), NO SOAKING/SUBMERGING IN THE BATHTUB.  If the bandage gets wet, change with a clean dry gauze.  If the incision gets wet, pat the wound dry with a clean towel. °You may start showering once you are discharged home but do not submerge the  incision under water. Just pat the incision dry and apply a dry gauze dressing on daily. °Change the surgical dressing daily and reapply a dry dressing each time. ° °ACTIVITY °Walk with your walker as instructed. °Use walker as long as suggested by your caregivers. °Avoid periods of inactivity such as sitting longer than an hour when not asleep. This helps prevent blood clots.  °You may resume a sexual relationship in one month or when given the OK by your doctor.  °You may return to work once you are cleared by your doctor.  °Do not drive a car for 6 weeks or until released by you surgeon.  °Do not drive while taking narcotics. ° °WEIGHT BEARING °Weight bearing as tolerated with assist device (walker, cane, etc) as directed, use it as long as suggested by your surgeon or therapist, typically at least 4-6 weeks. ° °POSTOPERATIVE CONSTIPATION PROTOCOL °Constipation - defined medically as fewer than three stools per week and severe constipation as less than one stool per week. ° °One of the most common issues patients have following surgery is constipation.  Even if you have a regular bowel pattern at home, your normal regimen is likely to be disrupted due to multiple reasons following surgery.  Combination of anesthesia, postoperative narcotics, change in appetite and fluid intake all can affect your bowels.  In order to avoid complications following surgery, here are some recommendations in order to help you during your recovery period. ° °Colace (docusate) - Pick up an over-the-counter form   of Colace or another stool softener and take twice a day as long as you are requiring postoperative pain medications.  Take with a full glass of water daily.  If you experience loose stools or diarrhea, hold the colace until you stool forms back up.  If your symptoms do not get better within 1 week or if they get worse, check with your doctor. ° °Dulcolax (bisacodyl) - Pick up over-the-counter and take as directed by the product  packaging as needed to assist with the movement of your bowels.  Take with a full glass of water.  Use this product as needed if not relieved by Colace only.  ° °MiraLax (polyethylene glycol) - Pick up over-the-counter to have on hand.  MiraLax is a solution that will increase the amount of water in your bowels to assist with bowel movements.  Take as directed and can mix with a glass of water, juice, soda, coffee, or tea.  Take if you go more than two days without a movement. °Do not use MiraLax more than once per day. Call your doctor if you are still constipated or irregular after using this medication for 7 days in a row. ° °If you continue to have problems with postoperative constipation, please contact the office for further assistance and recommendations.  If you experience "the worst abdominal pain ever" or develop nausea or vomiting, please contact the office immediatly for further recommendations for treatment. ° °ITCHING ° If you experience itching with your medications, try taking only a single pain pill, or even half a pain pill at a time.  You can also use Benadryl over the counter for itching or also to help with sleep.  ° °TED HOSE STOCKINGS °Wear the elastic stockings on both legs for three weeks following surgery during the day but you may remove then at night for sleeping. ° °MEDICATIONS °See your medication summary on the “After Visit Summary” that the nursing staff will review with you prior to discharge.  You may have some home medications which will be placed on hold until you complete the course of blood thinner medication.  It is important for you to complete the blood thinner medication as prescribed by your surgeon.  Continue your approved medications as instructed at time of discharge. ° °PRECAUTIONS °If you experience chest pain or shortness of breath - call 911 immediately for transfer to the hospital emergency department.  °If you develop a fever greater that 101 F, purulent drainage  from wound, increased redness or drainage from wound, foul odor from the wound/dressing, or calf pain - CONTACT YOUR SURGEON.   °                                                °FOLLOW-UP APPOINTMENTS °Make sure you keep all of your appointments after your operation with your surgeon and caregivers. You should call the office at the above phone number and make an appointment for approximately two weeks after the date of your surgery or on the date instructed by your surgeon outlined in the "After Visit Summary". ° °RANGE OF MOTION AND STRENGTHENING EXERCISES  °These exercises are designed to help you keep full movement of your hip joint. Follow your caregiver's or physical therapist's instructions. Perform all exercises about fifteen times, three times per day or as directed. Exercise both hips, even if you have   had only one joint replacement. These exercises can be done on a training (exercise) mat, on the floor, on a table or on a bed. Use whatever works the best and is most comfortable for you. Use music or television while you are exercising so that the exercises are a pleasant break in your day. This will make your life better with the exercises acting as a break in routine you can look forward to.  °• Lying on your back, slowly slide your foot toward your buttocks, raising your knee up off the floor. Then slowly slide your foot back down until your leg is straight again.  °• Lying on your back spread your legs as far apart as you can without causing discomfort.  °• Lying on your side, raise your upper leg and foot straight up from the floor as far as is comfortable. Slowly lower the leg and repeat.  °• Lying on your back, tighten up the muscle in the front of your thigh (quadriceps muscles). You can do this by keeping your leg straight and trying to raise your heel off the floor. This helps strengthen the largest muscle supporting your knee.  °• Lying on your back, tighten up the muscles of your buttocks both  with the legs straight and with the knee bent at a comfortable angle while keeping your heel on the floor.  ° °IF YOU ARE TRANSFERRED TO A SKILLED REHAB FACILITY °If the patient is transferred to a skilled rehab facility following release from the hospital, a list of the current medications will be sent to the facility for the patient to continue.  When discharged from the skilled rehab facility, please have the facility set up the patient's Home Health Physical Therapy prior to being released. Also, the skilled facility will be responsible for providing the patient with their medications at time of release from the facility to include their pain medication, the muscle relaxants, and their blood thinner medication. If the patient is still at the rehab facility at time of the two week follow up appointment, the skilled rehab facility will also need to assist the patient in arranging follow up appointment in our office and any transportation needs. ° °MAKE SURE YOU:  °• Understand these instructions.  °• Get help right away if you are not doing well or get worse.  ° ° °Pick up stool softner and laxative for home use following surgery while on pain medications. °Do not submerge incision under water. °Please use good hand washing techniques while changing dressing each day. °May shower starting three days after surgery. °Please use a clean towel to pat the incision dry following showers. °Continue to use ice for pain and swelling after surgery. °Do not use any lotions or creams on the incision until instructed by your surgeon. ° °

## 2018-08-13 NOTE — Anesthesia Procedure Notes (Signed)
Spinal  Patient location during procedure: OR Start time: 08/13/2018 12:44 PM End time: 08/13/2018 12:49 PM Staffing Anesthesiologist: Suzette Battiest, MD Resident/CRNA: Glory Buff, CRNA Performed: resident/CRNA  Preanesthetic Checklist Completed: patient identified, site marked, surgical consent, pre-op evaluation, timeout performed, IV checked, risks and benefits discussed and monitors and equipment checked Spinal Block Patient position: sitting Prep: DuraPrep Patient monitoring: heart rate, continuous pulse ox and blood pressure Approach: midline Location: L3-4 Injection technique: single-shot Needle Needle type: Pencan  Needle gauge: 24 G Needle length: 9 cm Needle insertion depth: 6 cm Assessment Sensory level: T6 Additional Notes Kit expiration date checked and verified.  Sterile prep and drape, skin local with 1% lidocaine, - heme, - paraesthesia, + CSF pre and post injection, patient tolerated well.

## 2018-08-14 ENCOUNTER — Encounter (HOSPITAL_COMMUNITY): Payer: Self-pay | Admitting: Orthopedic Surgery

## 2018-08-14 LAB — CBC
HEMATOCRIT: 38.6 % (ref 36.0–46.0)
Hemoglobin: 12.5 g/dL (ref 12.0–15.0)
MCH: 30 pg (ref 26.0–34.0)
MCHC: 32.4 g/dL (ref 30.0–36.0)
MCV: 92.6 fL (ref 78.0–100.0)
Platelets: 295 10*3/uL (ref 150–400)
RBC: 4.17 MIL/uL (ref 3.87–5.11)
RDW: 15.1 % (ref 11.5–15.5)
WBC: 13.4 10*3/uL — ABNORMAL HIGH (ref 4.0–10.5)

## 2018-08-14 LAB — BASIC METABOLIC PANEL
Anion gap: 6 (ref 5–15)
BUN: 10 mg/dL (ref 8–23)
CO2: 27 mmol/L (ref 22–32)
Calcium: 9.2 mg/dL (ref 8.9–10.3)
Chloride: 110 mmol/L (ref 98–111)
Creatinine, Ser: 0.62 mg/dL (ref 0.44–1.00)
GFR calc Af Amer: >60 mL/min (ref >60)
GFR calc non Af Amer: >60 mL/min (ref >60)
Glucose, Bld: 154 mg/dL — ABNORMAL HIGH (ref 70–99)
Potassium: 4.6 mmol/L (ref 3.5–5.1)
Sodium: 143 mmol/L (ref 135–145)

## 2018-08-14 NOTE — Progress Notes (Signed)
Physical Therapy Treatment Patient Details Name: Hailey Holmes MRN: [id number] DOB: 05-19-1951 Today's Date: 08/14/2018    History of Present Illness Pt is a 67 year old female s/p R THA    PT Comments    Pt assisted with ambulating in hallway and progressed distance.  Pt also performed LE exercises.  Pt plans to d/c home tomorrow.  Follow Up Recommendations  Follow surgeon's recommendation for DC plan and follow-up therapies     Equipment Recommendations  None recommended by PT    Recommendations for Other Services       Precautions / Restrictions Precautions Precautions: Fall Restrictions Other Position/Activity Restrictions: WBAT    Mobility  Bed Mobility Overal bed mobility: Needs Assistance Bed Mobility: Sit to Supine       Sit to supine: Min guard   General bed mobility comments: verbal cues for self assist  Transfers Overall transfer level: Needs assistance Equipment used: Rolling walker (2 wheeled) Transfers: Sit to/from Stand Sit to Stand: Min guard         General transfer comment: verbal cues for UE and LE positioning  Ambulation/Gait Ambulation/Gait assistance: Min guard Gait Distance (Feet): 120 Feet Assistive device: Rolling walker (2 wheeled) Gait Pattern/deviations: Step-to pattern;Decreased stance time - right;Antalgic     General Gait Details: verbal cues fo sequence, RW positioning, posture   Stairs             Wheelchair Mobility    Modified Rankin (Stroke Patients Only)       Balance                                            Cognition Arousal/Alertness: Awake/alert Behavior During Therapy: WFL for tasks assessed/performed Overall Cognitive Status: Within Functional Limits for tasks assessed                                        Exercises Total Joint Exercises Ankle Circles/Pumps: AROM;10 reps;Standing;Both Hip ABduction/ADduction: AROM;10 reps;Right;Standing Long Arc  Quad: AROM;10 reps;Right;Seated Knee Flexion: AROM;10 reps;Right;Standing Marching in Standing: AROM;10 reps;Right;Standing Standing Hip Extension: AROM;10 reps;Right;Standing(all standing exercises performed with UE support)    General Comments        Pertinent Vitals/Pain Pain Assessment: 0-10 Pain Score: 3  Pain Location: R hip and thigh Pain Descriptors / Indicators: Aching;Sore Pain Intervention(s): Repositioned;Monitored during session;Ice applied    Home Living                      Prior Function            PT Goals (current goals can now be found in the care plan section) Progress towards PT goals: Progressing toward goals    Frequency    7X/week      PT Plan Current plan remains appropriate    Co-evaluation              AM-PAC PT "6 Clicks" Daily Activity  Outcome Measure  Difficulty turning over in bed (including adjusting bedclothes, sheets and blankets)?: A Little Difficulty moving from lying on back to sitting on the side of the bed? : A Little Difficulty sitting down on and standing up from a chair with arms (e.g., wheelchair, bedside commode, etc,.)?: A Little Help needed moving to and from a bed  to chair (including a wheelchair)?: A Little Help needed walking in hospital room?: A Little Help needed climbing 3-5 steps with a railing? : A Little 6 Click Score: 18    End of Session Equipment Utilized During Treatment: Gait belt Activity Tolerance: Patient tolerated treatment well Patient left: with call bell/phone within reach;in bed;with family/visitor present Nurse Communication: Mobility status PT Visit Diagnosis: Other abnormalities of gait and mobility (R26.89)     Time: 1610-9604 PT Time Calculation (min) (ACUTE ONLY): 27 min  Charges:  $Gait Training: 8-22 mins $Therapeutic Exercise: 8-22 mins   Zenovia Jarred, PT, DPT Acute Rehabilitation Services Office: [phone] Pager: [phone]                    Sarajane Jews 08/14/2018, 3:47 PM

## 2018-08-14 NOTE — Progress Notes (Signed)
   Subjective: 1 Day Post-Op Procedure(s) (LRB): RIGHT TOTAL HIP ARTHROPLASTY ANTERIOR APPROACH (Right) Patient reports pain as mild.   Patient seen in rounds by Dr. Lequita Halt. Patient is well, and has had no acute complaints or problems. States she is feeling well this AM. Denies chest pain or SOB. Foley catheter removed this AM. No issues overnight. We will start therapy today.   Objective: Vital signs in last 24 hours: Temp:  [97.6 F (36.4 C)-98.5 F (36.9 C)] 98.1 F (36.7 C) (10/01 0527) Pulse Rate:  [70-103] 84 (10/01 0527) Resp:  [13-20] 16 (10/01 0527) BP: (95-158)/(59-93) 108/69 (10/01 0527) SpO2:  [94 %-100 %] 98 % (10/01 0527) Weight:  [96.2 kg] 96.2 kg (09/30 1054)  Intake/Output from previous day:  Intake/Output Summary (Last 24 hours) at 08/14/2018 0712 Last data filed at 08/14/2018 0650 Gross per 24 hour  Intake 3927.32 ml  Output 4365 ml  Net -437.68 ml    Labs: Recent Labs    08/14/18 0520  HGB 12.5   Recent Labs    08/14/18 0520  WBC 13.4*  RBC 4.17  HCT 38.6  PLT 295   Recent Labs    08/14/18 0520  NA 143  K 4.6  CL 110  CO2 27  BUN 10  CREATININE 0.62  GLUCOSE 154*  CALCIUM 9.2   Exam: General - Patient is Alert and Oriented Extremity - Neurologically intact Neurovascular intact Sensation intact distally Dorsiflexion/Plantar flexion intact Dressing - dressing C/D/I Motor Function - intact, moving foot and toes well on exam.   Past Medical History:  Diagnosis Date  . Arthritis   . Hypothyroidism   . Seasonal allergies   . Seasonal asthma     Assessment/Plan: 1 Day Post-Op Procedure(s) (LRB): RIGHT TOTAL HIP ARTHROPLASTY ANTERIOR APPROACH (Right) Principal Problem:   OA (osteoarthritis) of hip  Estimated body mass index is 36.39 kg/m as calculated from the following:   Height as of this encounter: 5\' 4"  (1.626 m).   Weight as of this encounter: 96.2 kg. Advance diet Up with therapy  DVT Prophylaxis - Aspirin Weight  bearing as tolerated. D/C O2 and pulse ox and try on room air. Hemovac pulled without difficulty, will begin therapy.  Plan is to go Home after hospital stay. Plan for discharge tomorrow if meeting goals with therapy. Consult for HHPT ordered.  Arther Abbott, PA-C Orthopedic Surgery 08/14/2018, 7:12 AM

## 2018-08-14 NOTE — Care Management Note (Signed)
Case Management Note  Patient Details  Name: Hailey Holmes MRN: [id number] Date of Birth: 1951/10/28  Subjective/Objective:  Discharge planning, spoke with patient and spouse at bedside. Have chosen [facility] for [facility] PT, evaluate and treat.   Action/Plan: [facility]ed [facility] for referral. Has DME. [phone]                  Expected Discharge Date:                  Expected Discharge Plan:  Home w Home Health Services  In-House Referral:  NA  Discharge planning Services  CM Consult  Post Acute Care Choice:  Home Health Choice offered to:  Patient  DME Arranged:  N/A DME Agency:  NA  HH Arranged:  PT HH Agency:  [facility] ([facility]  Status of Service:  Completed, signed off  If discussed at [employer], dates discussed:    Additional Comments:  Alexis Goodell, RN 08/14/2018, 11:14 AM

## 2018-08-14 NOTE — Evaluation (Signed)
Physical Therapy Evaluation Patient Details Name: Hailey Holmes MRN: [id number] DOB: [birth year] Today's Date: 08/14/2018   History of Present Illness  Pt is a 67 year old female s/p R THA  Clinical Impression  Pt is s/p THA resulting in the deficits listed below (see PT Problem List).  Pt will benefit from skilled PT to increase their independence and safety with mobility to allow discharge to the venue listed below.  Pt reports painful night however pain better controlled this morning.  Pt assisted with ambulating in hallway.  Pt reports plan for d/c home alone tomorrow.       Follow Up Recommendations Follow surgeon's recommendation for DC plan and follow-up therapies    Equipment Recommendations  None recommended by PT    Recommendations for Other Services       Precautions / Restrictions Precautions Precautions: Fall Restrictions Weight Bearing Restrictions: No Other Position/Activity Restrictions: WBAT      Mobility  Bed Mobility Overal bed mobility: Needs Assistance Bed Mobility: Supine to Sit     Supine to sit: Min guard     General bed mobility comments: verbal cues for self assist  Transfers Overall transfer level: Needs assistance Equipment used: Rolling walker (2 wheeled) Transfers: Sit to/from Stand Sit to Stand: Min guard         General transfer comment: verbal cues for UE and LE positioning  Ambulation/Gait Ambulation/Gait assistance: Min guard Gait Distance (Feet): 100 Feet Assistive device: Rolling walker (2 wheeled) Gait Pattern/deviations: Step-to pattern;Decreased stance time - right;Antalgic     General Gait Details: verbal cues fo sequence, RW positioning, posture  Stairs            Wheelchair Mobility    Modified Rankin (Stroke Patients Only)       Balance                                             Pertinent Vitals/Pain Pain Assessment: 0-10 Pain Score: 3  Pain Location: R hip and thigh Pain  Descriptors / Indicators: Aching;Sore Pain Intervention(s): Limited activity within patient's tolerance;Repositioned;Monitored during session;Ice applied    Home Living Family/patient expects to be discharged to:: Private residence Living Arrangements: Alone   Type of Home: House Home Access: Level entry     Home Layout: One level Home Equipment: Environmental consultant - 2 wheels      Prior Function Level of Independence: Independent               Hand Dominance        Extremity/Trunk Assessment        Lower Extremity Assessment Lower Extremity Assessment: RLE deficits/detail RLE Deficits / Details: anticipated post op hip weakness, able to perform ankle pumps       Communication   Communication: No difficulties  Cognition Arousal/Alertness: Awake/alert Behavior During Therapy: WFL for tasks assessed/performed Overall Cognitive Status: Within Functional Limits for tasks assessed                                        General Comments      Exercises     Assessment/Plan    PT Assessment Patient needs continued PT services  PT Problem List Decreased strength;Decreased mobility;Decreased activity tolerance;Pain;Decreased knowledge of use of DME  PT Treatment Interventions Gait training;Therapeutic exercise;Stair training;Functional mobility training;Patient/family education;DME instruction;Therapeutic activities    PT Goals (Current goals can be found in the Care Plan section)  Acute Rehab PT Goals PT Goal Formulation: With patient Time For Goal Achievement: 08/18/18 Potential to Achieve Goals: Good    Frequency 7X/week   Barriers to discharge        Co-evaluation               AM-PAC PT "6 Clicks" Daily Activity  Outcome Measure Difficulty turning over in bed (including adjusting bedclothes, sheets and blankets)?: A Little Difficulty moving from lying on back to sitting on the side of the bed? : A Lot Difficulty sitting down on  and standing up from a chair with arms (e.g., wheelchair, bedside commode, etc,.)?: Unable Help needed moving to and from a bed to chair (including a wheelchair)?: A Little Help needed walking in hospital room?: A Little Help needed climbing 3-5 steps with a railing? : A Lot 6 Click Score: 14    End of Session Equipment Utilized During Treatment: Gait belt Activity Tolerance: Patient tolerated treatment well Patient left: in chair;with call bell/phone within reach Nurse Communication: Mobility status PT Visit Diagnosis: Other abnormalities of gait and mobility (R26.89)    Time: 1610-9604 PT Time Calculation (min) (ACUTE ONLY): 17 min   Charges:   PT Evaluation $PT Eval Low Complexity: 1 Low         Zenovia Jarred, PT, DPT Acute Rehabilitation Services Office: [phone] Pager: [phone]   Maida Sale E 08/14/2018, 10:40 AM

## 2018-08-15 LAB — BASIC METABOLIC PANEL
ANION GAP: 8 (ref 5–15)
BUN: 11 mg/dL (ref 8–23)
CO2: 26 mmol/L (ref 22–32)
Calcium: 9.2 mg/dL (ref 8.9–10.3)
Chloride: 108 mmol/L (ref 98–111)
Creatinine, Ser: 0.57 mg/dL (ref 0.44–1.00)
Glucose, Bld: 121 mg/dL — ABNORMAL HIGH (ref 70–99)
POTASSIUM: 4.3 mmol/L (ref 3.5–5.1)
SODIUM: 142 mmol/L (ref 135–145)

## 2018-08-15 LAB — CBC
HCT: 37.4 % (ref 36.0–46.0)
Hemoglobin: 12 g/dL (ref 12.0–15.0)
MCH: 29.7 pg (ref 26.0–34.0)
MCHC: 32.1 g/dL (ref 30.0–36.0)
MCV: 92.6 fL (ref 78.0–100.0)
Platelets: 287 10*3/uL (ref 150–400)
RBC: 4.04 MIL/uL (ref 3.87–5.11)
RDW: 15.5 % (ref 11.5–15.5)
WBC: 13 10*3/uL — AB (ref 4.0–10.5)

## 2018-08-15 MED ORDER — ASPIRIN 325 MG PO TBEC: 325.0000 mg | DELAYED_RELEASE_TABLET | Freq: Two times a day (BID) | ORAL | 0 refills | Status: DC

## 2018-08-15 MED ORDER — SODIUM CHLORIDE 0.9 % IV BOLUS: 500.0000 mL | Freq: Once | INTRAVENOUS | Status: DC

## 2018-08-15 MED ORDER — HYDROMORPHONE HCL 2 MG PO TABS: 2.0000 mg | ORAL_TABLET | Freq: Four times a day (QID) | ORAL | 0 refills | Status: DC | PRN

## 2018-08-15 MED ORDER — METHOCARBAMOL 500 MG PO TABS: 500.0000 mg | ORAL_TABLET | Freq: Four times a day (QID) | ORAL | 0 refills | Status: DC | PRN

## 2018-08-15 MED ORDER — SODIUM CHLORIDE 0.9 % IV BOLUS
500.0000 mL | Freq: Once | INTRAVENOUS | Status: AC
  Administered 2018-08-15: 500 mL via INTRAVENOUS

## 2018-08-15 NOTE — Progress Notes (Signed)
During rounds, pt's family member at the bedside stated that she had noticed that pt was not articulating and speaking as clearly as she normally does after having hypotensive episode earlier in the morning.  Pt had dilaudid po 1159.  Pt also complained of flushing in the cheeks.    PA Kristie paged and made aware.  All neuro assessments clear, no other symptoms.  RN to monitor.

## 2018-08-15 NOTE — Progress Notes (Signed)
Physical Therapy Treatment Patient Details Name: Hailey Holmes MRN: [id number] DOB: 08/08/1951 Today's Date: 08/15/2018    History of Present Illness Pt is a 67 year old female s/p R THA    PT Comments    Pt ambulated in hallway and returned to room to perform standing exercises.  Pt performed a few exercises and then reported increased dizziness so pt assisted to recliner.  BP 59/31 mmHg, HR 53 bpm, SPO2 99% room air.  After pt reclined for a couple minutes BP 65/49 mmHg. RN called to room and left pt with RN to further assess.   Follow Up Recommendations  Follow surgeon's recommendation for DC plan and follow-up therapies     Equipment Recommendations  None recommended by PT    Recommendations for Other Services       Precautions / Restrictions Precautions Precautions: Fall Restrictions Other Position/Activity Restrictions: WBAT    Mobility  Bed Mobility               General bed mobility comments: pt up in recliner on arrival  Transfers Overall transfer level: Needs assistance Equipment used: Rolling walker (2 wheeled) Transfers: Sit to/from Stand Sit to Stand: Min guard;Min assist         General transfer comment: verbal cues for UE and LE positioning; assist for return to sitting due to dizziness  Ambulation/Gait Ambulation/Gait assistance: Min guard Gait Distance (Feet): 120 Feet Assistive device: Rolling walker (2 wheeled) Gait Pattern/deviations: Step-to pattern;Decreased stance time - right;Antalgic;Step-through pattern     General Gait Details: verbal cues fo sequence, RW positioning, posture   Stairs             Wheelchair Mobility    Modified Rankin (Stroke Patients Only)       Balance                                            Cognition Arousal/Alertness: Awake/alert Behavior During Therapy: WFL for tasks assessed/performed Overall Cognitive Status: Within Functional Limits for tasks assessed                                         Exercises Total Joint Exercises Hip ABduction/ADduction: AROM;10 reps;Right;Standing Marching in Standing: AROM;10 reps;Right;Standing Standing Hip Extension: AROM;10 reps;Right;Standing(all standing exercises performed with UE support)    General Comments        Pertinent Vitals/Pain Pain Assessment: 0-10 Pain Score: 4  Pain Location: R hip and thigh Pain Descriptors / Indicators: Aching;Sore Pain Intervention(s): Limited activity within patient's tolerance;Repositioned;Monitored during session;Ice applied    Home Living                      Prior Function            PT Goals (current goals can now be found in the care plan section) Progress towards PT goals: Progressing toward goals    Frequency    7X/week      PT Plan Current plan remains appropriate    Co-evaluation              AM-PAC PT "6 Clicks" Daily Activity  Outcome Measure  Difficulty turning over in bed (including adjusting bedclothes, sheets and blankets)?: A Little Difficulty moving from lying on back to sitting on the  side of the bed? : A Little Difficulty sitting down on and standing up from a chair with arms (e.g., wheelchair, bedside commode, etc,.)?: A Little Help needed moving to and from a bed to chair (including a wheelchair)?: A Little Help needed walking in hospital room?: A Little Help needed climbing 3-5 steps with a railing? : A Little 6 Click Score: 18    End of Session Equipment Utilized During Treatment: Gait belt Activity Tolerance: Treatment limited secondary to medical complications (Comment) Patient left: with call bell/phone within reach;with family/visitor present;in chair;with nursing/sitter in room Nurse Communication: Mobility status PT Visit Diagnosis: Other abnormalities of gait and mobility (R26.89)     Time: 1610-9604 PT Time Calculation (min) (ACUTE ONLY): 23 min  Charges:  $Gait Training: 8-22  mins $Therapeutic Exercise: 8-22 mins                     Zenovia Jarred, PT, DPT Acute Rehabilitation Services Office: [phone] Pager: [phone]  Sarajane Jews 08/15/2018, 12:32 PM

## 2018-08-15 NOTE — Progress Notes (Signed)
Pt became very dizzy, clammy, and b/p dropped when working with PT during the morning session.  RN made aware, and pt was placed in chair.  Bp 81/52 manual, as pt rested she stated she started to feel better.  Bp up to 120/60.    PA Danford Bad made aware. 500cc bolus,  second session of PT, RN will monitor.

## 2018-08-15 NOTE — Progress Notes (Signed)
Physical Therapy Treatment Patient Details Name: Hailey Holmes MRN: [id number] DOB: 08-01-1951 Today's Date: 08/15/2018    History of Present Illness Pt is a 66 year old female s/p R THA    PT Comments    Pt ambulated in hallway and assisted to bathroom upon returning to room.  Pt with no c/o dizziness this afternoon.  Pt performed a couple exercises and verbally reviewed HEP handout.  Pt aware to sit down if feeling dizzy at home and encouraged her friend (who will assist her home) to use gait belt initially just for an extra fall precaution.     Follow Up Recommendations  Follow surgeon's recommendation for DC plan and follow-up therapies     Equipment Recommendations  None recommended by PT    Recommendations for Other Services       Precautions / Restrictions Precautions Precautions: Fall Restrictions Other Position/Activity Restrictions: WBAT    Mobility  Bed Mobility Overal bed mobility: Needs Assistance Bed Mobility: Supine to Sit;Sit to Supine     Supine to sit: Supervision Sit to supine: Supervision   General bed mobility comments: verbal cues for self assist  Transfers Overall transfer level: Needs assistance Equipment used: Rolling walker (2 wheeled) Transfers: Sit to/from Stand Sit to Stand: Min guard         General transfer comment: verbal cues for UE and LE positioning  Ambulation/Gait Ambulation/Gait assistance: Min guard Gait Distance (Feet): 80 Feet Assistive device: Rolling walker (2 wheeled) Gait Pattern/deviations: Decreased stance time - right;Antalgic;Step-through pattern     General Gait Details: verbal cues for RW positioning, posture   Stairs             Wheelchair Mobility    Modified Rankin (Stroke Patients Only)       Balance                                            Cognition Arousal/Alertness: Awake/alert Behavior During Therapy: WFL for tasks assessed/performed Overall Cognitive  Status: Within Functional Limits for tasks assessed                                        Exercises Total Joint Exercises Ankle Circles/Pumps: AROM;10 reps;Standing;Both  Long Arc Quad: AROM;10 reps;Right;Seated Marching in Standing: AROM;10 reps;Right;Standing     General Comments        Pertinent Vitals/Pain Pain Assessment: 0-10 Pain Score: 4  Pain Location: R hip and thigh Pain Descriptors / Indicators: Aching;Sore Pain Intervention(s): Limited activity within patient's tolerance;Repositioned;Monitored during session    Home Living                      Prior Function            PT Goals (current goals can now be found in the care plan section) Progress towards PT goals: Progressing toward goals    Frequency    7X/week      PT Plan Current plan remains appropriate    Co-evaluation              AM-PAC PT "6 Clicks" Daily Activity  Outcome Measure  Difficulty turning over in bed (including adjusting bedclothes, sheets and blankets)?: A Little Difficulty moving from lying on back to sitting on the side of the  bed? : A Little Difficulty sitting down on and standing up from a chair with arms (e.g., wheelchair, bedside commode, etc,.)?: A Little Help needed moving to and from a bed to chair (including a wheelchair)?: A Little Help needed walking in hospital room?: A Little Help needed climbing 3-5 steps with a railing? : A Little 6 Click Score: 18    End of Session Equipment Utilized During Treatment: Gait belt Activity Tolerance: Patient tolerated treatment well Patient left: with call bell/phone within reach;with family/visitor present;in bed Nurse Communication: Mobility status PT Visit Diagnosis: Other abnormalities of gait and mobility (R26.89)     Time: 4540-9811 PT Time Calculation (min) (ACUTE ONLY): 28 min  Charges:  $Gait Training: 23-37 mins                     Zenovia Jarred, PT, DPT Acute Rehabilitation  Services Office: [phone] Pager: [phone]  Hailey Holmes 08/15/2018, 3:08 PM

## 2018-08-15 NOTE — Progress Notes (Signed)
   Subjective: 2 Days Post-Op Procedure(s) (LRB): RIGHT TOTAL HIP ARTHROPLASTY ANTERIOR APPROACH (Right) Patient reports pain as mild.   Patient seen in rounds with Dr. Lequita Halt. Patient is well, and has had no acute complaints or problems other than soreness in the right hip. States she is ready to go home. Voiding without difficulty and positive flatus. Denies chest pain or SOB. No issues overnight. Plan is to go Home after hospital stay.  Objective: Vital signs in last 24 hours: Temp:  [97.5 F (36.4 C)-98.9 F (37.2 C)] 97.5 F (36.4 C) (10/01 2214) Pulse Rate:  [79-87] 82 (10/01 2214) Resp:  [14-20] 20 (10/01 2214) BP: (112-125)/(67-80) 118/73 (10/01 2214) SpO2:  [96 %-99 %] 97 % (10/01 2214)  Intake/Output from previous day:  Intake/Output Summary (Last 24 hours) at 08/15/2018 0722 Last data filed at 08/15/2018 0634 Gross per 24 hour  Intake 1164.14 ml  Output 1900 ml  Net -735.86 ml    Labs: Recent Labs    08/14/18 0520 08/15/18 0404  HGB 12.5 12.0   Recent Labs    08/14/18 0520 08/15/18 0404  WBC 13.4* 13.0*  RBC 4.17 4.04  HCT 38.6 37.4  PLT 295 287   Recent Labs    08/14/18 0520 08/15/18 0404  NA 143 142  K 4.6 4.3  CL 110 108  CO2 27 26  BUN 10 11  CREATININE 0.62 0.57  GLUCOSE 154* 121*  CALCIUM 9.2 9.2   Exam: General - Patient is Alert and Oriented Extremity - Neurologically intact Neurovascular intact Sensation intact distally Dorsiflexion/Plantar flexion intact Dressing/Incision - clean, dry, no drainage Motor Function - intact, moving foot and toes well on exam.   Past Medical History:  Diagnosis Date  . Arthritis   . Hypothyroidism   . Seasonal allergies   . Seasonal asthma     Assessment/Plan: 2 Days Post-Op Procedure(s) (LRB): RIGHT TOTAL HIP ARTHROPLASTY ANTERIOR APPROACH (Right) Principal Problem:   OA (osteoarthritis) of hip  Estimated body mass index is 36.39 kg/m as calculated from the following:   Height as of  this encounter: 5\' 4"  (1.626 m).   Weight as of this encounter: 96.2 kg. Up with therapy D/C IV fluids  DVT Prophylaxis - Aspirin Weight-bearing as tolerated  Plan for discharge today after one session of therapy with HHPT. Follow-up in the office in 2 weeks with Dr. Lequita Halt.  Arther Abbott, PA-C Orthopedic Surgery 08/15/2018, 7:22 AM

## 2018-08-16 LAB — CBC
HEMATOCRIT: 34.9 % — AB (ref 36.0–46.0)
Hemoglobin: 11.4 g/dL — ABNORMAL LOW (ref 12.0–15.0)
MCH: 30.8 pg (ref 26.0–34.0)
MCHC: 32.7 g/dL (ref 30.0–36.0)
MCV: 94.3 fL (ref 78.0–100.0)
Platelets: 258 10*3/uL (ref 150–400)
RBC: 3.7 MIL/uL — ABNORMAL LOW (ref 3.87–5.11)
RDW: 16 % — ABNORMAL HIGH (ref 11.5–15.5)
WBC: 8.6 10*3/uL (ref 4.0–10.5)

## 2018-08-16 LAB — GLUCOSE, CAPILLARY: Glucose-Capillary: 90 mg/dL (ref 70–99)

## 2018-08-16 MED ORDER — ASPIRIN 325 MG PO TBEC: 325.0000 mg | DELAYED_RELEASE_TABLET | Freq: Two times a day (BID) | ORAL | 0 refills | Status: AC

## 2018-08-16 MED ORDER — METHOCARBAMOL 500 MG PO TABS: 500.0000 mg | ORAL_TABLET | Freq: Four times a day (QID) | ORAL | 0 refills | Status: AC | PRN

## 2018-08-16 MED ORDER — HYDROMORPHONE HCL 2 MG PO TABS: 2.0000 mg | ORAL_TABLET | Freq: Four times a day (QID) | ORAL | 0 refills | Status: DC | PRN

## 2018-08-16 NOTE — Progress Notes (Signed)
Physical Therapy Treatment Patient Details Name: Hailey Holmes MRN: [id number] DOB: August 13, 1951 Today's Date: 08/16/2018    History of Present Illness Pt is a 67 year old female s/p R THA    PT Comments    Pt progressing well today; mild dizziness with initiation of mobility which resolved within a few seconds; pt able to amb and perform standing ex's with c/o only of fatigue; ready for d/c from mobility/PT standpoint; Pt and family educated on safety, taking extra time with transitions etc--if she should exhibit any dizziness  at home  Follow Up Recommendations  Follow surgeon's recommendation for DC plan and follow-up therapies     Equipment Recommendations  None recommended by PT    Recommendations for Other Services       Precautions / Restrictions Precautions Precautions: Fall Restrictions Weight Bearing Restrictions: No Other Position/Activity Restrictions: WBAT    Mobility  Bed Mobility               General bed mobility comments: pt received in chair  Transfers Overall transfer level: Needs assistance Equipment used: Rolling walker (2 wheeled) Transfers: Sit to/from Stand Sit to Stand: Min guard         General transfer comment: verbal cues for UE and LE positioning  Ambulation/Gait Ambulation/Gait assistance: Supervision Gait Distance (Feet): 65 Feet(10' more) Assistive device: Rolling walker (2 wheeled) Gait Pattern/deviations: Decreased stance time - right;Antalgic;Step-through pattern Gait velocity: decr   General Gait Details: verbal cues for RW positioning, posture, heel strike on R; mild dizziness on initial stand which resolved within a Animal nutritionist    Modified Rankin (Stroke Patients Only)       Balance                                            Cognition Arousal/Alertness: Awake/alert Behavior During Therapy: WFL for tasks assessed/performed Overall Cognitive Status:  Within Functional Limits for tasks assessed                                        Exercises Total Joint Exercises Hip ABduction/ADduction: AROM;10 reps;Right;Standing Knee Flexion: AROM;10 reps;Right;Standing Marching in Standing: AROM;10 reps;Right;Standing Standing Hip Extension: AROM;10 reps;Right;Standing    General Comments        Pertinent Vitals/Pain Pain Assessment: 0-10 Pain Score: 4  Pain Location: R hip and thigh Pain Descriptors / Indicators: Aching;Sore Pain Intervention(s): Limited activity within patient's tolerance;Monitored during session;Premedicated before session;Ice applied    Home Living                      Prior Function            PT Goals (current goals can now be found in the care plan section) Acute Rehab PT Goals PT Goal Formulation: With patient Time For Goal Achievement: 08/18/18 Potential to Achieve Goals: Good Progress towards PT goals: Progressing toward goals    Frequency    7X/week      PT Plan Current plan remains appropriate    Co-evaluation              AM-PAC PT "6 Clicks" Daily Activity  Outcome Measure  Difficulty turning over in bed (including  adjusting bedclothes, sheets and blankets)?: A Little Difficulty moving from lying on back to sitting on the side of the bed? : A Little Difficulty sitting down on and standing up from a chair with arms (e.g., wheelchair, bedside commode, etc,.)?: A Little Help needed moving to and from a bed to chair (including a wheelchair)?: A Little Help needed walking in hospital room?: A Little Help needed climbing 3-5 steps with a railing? : A Little 6 Click Score: 18    End of Session Equipment Utilized During Treatment: Gait belt Activity Tolerance: Patient tolerated treatment well Patient left: in chair;with call bell/phone within reach;with family/visitor present;with chair alarm set Nurse Communication: Mobility status PT Visit Diagnosis: Other  abnormalities of gait and mobility (R26.89)     Time: 8119-1478 PT Time Calculation (min) (ACUTE ONLY): 29 min  Charges:  $Gait Training: 23-37 mins                     Drucilla Chalet, PT Pager: [phone] 08/16/2018   [facility] Acute Rehab Dept [phone]    [facility] 08/16/2018, 11:53 AM

## 2018-08-16 NOTE — Progress Notes (Signed)
   Subjective: 3 Days Post-Op Procedure(s) (LRB): RIGHT TOTAL HIP ARTHROPLASTY ANTERIOR APPROACH (Right) Patient reports pain as mild.   Patient seen in rounds with Dr. Lequita Halt. Patient is well, and has had no acute complaints or problems. Had some issues with dizziness yesterday, given 500 mL bolus with improvement of symptoms. States she is ready to go home today. Voiding without difficulty and positive flatus. Denies chest pain or SOB.  Plan is to go Home after hospital stay.  Objective: Vital signs in last 24 hours: Temp:  [97.6 F (36.4 C)-98.6 F (37 C)] 98.4 F (36.9 C) (10/03 0550) Pulse Rate:  [78-100] 82 (10/03 0550) Resp:  [16-18] 17 (10/03 0550) BP: (81-132)/(52-76) 114/76 (10/03 0550) SpO2:  [91 %-100 %] 98 % (10/03 0550)  Intake/Output from previous day:  Intake/Output Summary (Last 24 hours) at 08/16/2018 0807 Last data filed at 08/16/2018 0755 Gross per 24 hour  Intake 1160 ml  Output 2100 ml  Net -940 ml    Intake/Output this shift: Total I/O In: 240 [P.O.:240] Out: 150 [Urine:150]  Labs: Recent Labs    08/14/18 0520 08/15/18 0404 08/16/18 0434  HGB 12.5 12.0 11.4*   Recent Labs    08/15/18 0404 08/16/18 0434  WBC 13.0* 8.6  RBC 4.04 3.70*  HCT 37.4 34.9*  PLT 287 258   Recent Labs    08/14/18 0520 08/15/18 0404  NA 143 142  K 4.6 4.3  CL 110 108  CO2 27 26  BUN 10 11  CREATININE 0.62 0.57  GLUCOSE 154* 121*  CALCIUM 9.2 9.2   Exam: General - Patient is Alert and Oriented Extremity - Neurologically intact Neurovascular intact Sensation intact distally Dorsiflexion/Plantar flexion intact Dressing/Incision - clean, dry, no drainage Motor Function - intact, moving foot and toes well on exam.   Past Medical History:  Diagnosis Date  . Arthritis   . Hypothyroidism   . Seasonal allergies   . Seasonal asthma     Assessment/Plan: 3 Days Post-Op Procedure(s) (LRB): RIGHT TOTAL HIP ARTHROPLASTY ANTERIOR APPROACH  (Right) Principal Problem:   OA (osteoarthritis) of hip  Estimated body mass index is 36.39 kg/m as calculated from the following:   Height as of this encounter: 5\' 4"  (1.626 m).   Weight as of this encounter: 96.2 kg. Up with therapy  DVT Prophylaxis - Aspirin Weight-bearing as tolerated  Plan for discharge around lunchtime today after one session of therapy. Scheduled for HHPT, f/u in office in 2 weeks.  Arther Abbott, PA-C Orthopedic Surgery 08/16/2018, 8:07 AM

## 2018-08-16 NOTE — Progress Notes (Signed)
Pt was provided with discharge instructions and prescriptions. After discussing the pt's plan of care upon D/C home today, the pt reported no further questions or concerns.

## 2018-08-18 ENCOUNTER — Emergency Department (HOSPITAL_COMMUNITY): Payer: Medicare Other

## 2018-08-18 ENCOUNTER — Other Ambulatory Visit: Payer: Self-pay

## 2018-08-18 ENCOUNTER — Observation Stay (HOSPITAL_COMMUNITY)
Admission: EM | Admit: 2018-08-18 | Discharge: 2018-08-20 | Disposition: A | Discharge status: Home / Self Care | Payer: Medicare Other | Attending: Family Medicine | Admitting: Family Medicine

## 2018-08-18 ENCOUNTER — Encounter (HOSPITAL_COMMUNITY): Payer: Self-pay

## 2018-08-18 DIAGNOSIS — K8020 Calculus of gallbladder without cholecystitis without obstruction: Secondary | ICD-10-CM | POA: Insufficient documentation

## 2018-08-18 DIAGNOSIS — Z885 Allergy status to narcotic agent status: Secondary | ICD-10-CM | POA: Insufficient documentation

## 2018-08-18 DIAGNOSIS — M5136 Other intervertebral disc degeneration, lumbar region: Secondary | ICD-10-CM | POA: Diagnosis not present

## 2018-08-18 DIAGNOSIS — Z79899 Other long term (current) drug therapy: Secondary | ICD-10-CM | POA: Diagnosis not present

## 2018-08-18 DIAGNOSIS — E876 Hypokalemia: Secondary | ICD-10-CM | POA: Insufficient documentation

## 2018-08-18 DIAGNOSIS — Z96641 Presence of right artificial hip joint: Secondary | ICD-10-CM | POA: Diagnosis not present

## 2018-08-18 DIAGNOSIS — Z7982 Long term (current) use of aspirin: Secondary | ICD-10-CM | POA: Diagnosis not present

## 2018-08-18 DIAGNOSIS — N281 Cyst of kidney, acquired: Secondary | ICD-10-CM | POA: Diagnosis not present

## 2018-08-18 DIAGNOSIS — Z880 Allergy status to penicillin: Secondary | ICD-10-CM | POA: Diagnosis not present

## 2018-08-18 DIAGNOSIS — K7689 Other specified diseases of liver: Secondary | ICD-10-CM | POA: Diagnosis not present

## 2018-08-18 DIAGNOSIS — E039 Hypothyroidism, unspecified: Secondary | ICD-10-CM | POA: Diagnosis not present

## 2018-08-18 DIAGNOSIS — Z881 Allergy status to other antibiotic agents status: Secondary | ICD-10-CM | POA: Diagnosis not present

## 2018-08-18 DIAGNOSIS — K449 Diaphragmatic hernia without obstruction or gangrene: Secondary | ICD-10-CM | POA: Insufficient documentation

## 2018-08-18 DIAGNOSIS — K529 Noninfective gastroenteritis and colitis, unspecified: Principal | ICD-10-CM | POA: Diagnosis present

## 2018-08-18 LAB — BASIC METABOLIC PANEL
Anion gap: 11 (ref 5–15)
BUN: 9 mg/dL (ref 8–23)
CHLORIDE: 103 mmol/L (ref 98–111)
CO2: 25 mmol/L (ref 22–32)
CREATININE: 0.6 mg/dL (ref 0.44–1.00)
Calcium: 9.1 mg/dL (ref 8.9–10.3)
GFR calc non Af Amer: >60 mL/min (ref >60)
Glucose, Bld: 140 mg/dL — ABNORMAL HIGH (ref 70–99)
Potassium: 3.6 mmol/L (ref 3.5–5.1)
Sodium: 139 mmol/L (ref 135–145)

## 2018-08-18 LAB — CBC WITH DIFFERENTIAL/PLATELET
BASOS PCT: 0 %
Basophils Absolute: 0 10*3/uL (ref 0.0–0.1)
EOS ABS: 0 10*3/uL (ref 0.0–0.7)
Eosinophils Relative: 0 %
HEMATOCRIT: 37.8 % (ref 36.0–46.0)
HEMOGLOBIN: 12.7 g/dL (ref 12.0–15.0)
Lymphocytes Relative: 5 %
Lymphs Abs: 0.7 10*3/uL (ref 0.7–4.0)
MCH: 30.2 pg (ref 26.0–34.0)
MCHC: 33.6 g/dL (ref 30.0–36.0)
MCV: 90 fL (ref 78.0–100.0)
MONOS PCT: 10 %
Monocytes Absolute: 1.4 10*3/uL — ABNORMAL HIGH (ref 0.1–1.0)
NEUTROS ABS: 11.3 10*3/uL — AB (ref 1.7–7.7)
Neutrophils Relative %: 85 %
PLATELETS: 263 10*3/uL (ref 150–400)
RBC: 4.2 MIL/uL (ref 3.87–5.11)
RDW: 14.8 % (ref 11.5–15.5)
WBC: 13.4 10*3/uL — AB (ref 4.0–10.5)

## 2018-08-18 LAB — URINALYSIS, ROUTINE W REFLEX MICROSCOPIC
BILIRUBIN URINE: NEGATIVE
GLUCOSE, UA: NEGATIVE mg/dL
KETONES UR: NEGATIVE mg/dL
Nitrite: NEGATIVE
PH: 6 (ref 5.0–8.0)
Protein, ur: NEGATIVE mg/dL
Specific Gravity, Urine: 1.01 (ref 1.005–1.030)

## 2018-08-18 LAB — CBG MONITORING, ED: Glucose-Capillary: 153 mg/dL — ABNORMAL HIGH (ref 70–99)

## 2018-08-18 LAB — LACTIC ACID, PLASMA: Lactic Acid, Venous: 1.1 mmol/L (ref 0.5–1.9)

## 2018-08-18 MED ORDER — ENOXAPARIN SODIUM 40 MG/0.4ML ~~LOC~~ SOLN
40.0000 mg | SUBCUTANEOUS | Status: DC
  Administered 2018-08-18 – 2018-08-19 (×2): 40 mg via SUBCUTANEOUS
  Filled 2018-08-18 (×2): qty 0.4

## 2018-08-18 MED ORDER — TRAMADOL HCL 50 MG PO TABS: 50.0000 mg | ORAL_TABLET | Freq: Four times a day (QID) | ORAL | Status: DC | PRN

## 2018-08-18 MED ORDER — METRONIDAZOLE IN NACL 5-0.79 MG/ML-% IV SOLN
500.0000 mg | Freq: Once | INTRAVENOUS | Status: AC
  Administered 2018-08-18: 500 mg via INTRAVENOUS
  Filled 2018-08-18: qty 100

## 2018-08-18 MED ORDER — LIP MEDEX EX OINT
TOPICAL_OINTMENT | CUTANEOUS | Status: AC
  Filled 2018-08-18: qty 7

## 2018-08-18 MED ORDER — SODIUM CHLORIDE 0.9 % IV SOLN
2.0000 g | Freq: Once | INTRAVENOUS | Status: AC
  Administered 2018-08-18: 2 g via INTRAVENOUS
  Filled 2018-08-18: qty 20

## 2018-08-18 MED ORDER — METRONIDAZOLE IN NACL 5-0.79 MG/ML-% IV SOLN
500.0000 mg | Freq: Three times a day (TID) | INTRAVENOUS | Status: DC
  Administered 2018-08-18 – 2018-08-20 (×5): 500 mg via INTRAVENOUS
  Filled 2018-08-18 (×5): qty 100

## 2018-08-18 MED ORDER — LEVOTHYROXINE SODIUM 100 MCG PO TABS
100.0000 ug | ORAL_TABLET | Freq: Every day | ORAL | Status: DC
  Administered 2018-08-19 – 2018-08-20 (×2): 100 ug via ORAL
  Filled 2018-08-18 (×2): qty 1

## 2018-08-18 MED ORDER — METHOCARBAMOL 500 MG PO TABS
500.0000 mg | ORAL_TABLET | Freq: Four times a day (QID) | ORAL | Status: DC | PRN
  Administered 2018-08-18 – 2018-08-19 (×2): 500 mg via ORAL
  Filled 2018-08-18 (×2): qty 1

## 2018-08-18 MED ORDER — FLEET ENEMA 7-19 GM/118ML RE ENEM
1.0000 | ENEMA | Freq: Once | RECTAL | Status: AC
  Administered 2018-08-18: 1 via RECTAL
  Filled 2018-08-18: qty 1

## 2018-08-18 MED ORDER — SODIUM CHLORIDE 0.9 % IV SOLN
INTRAVENOUS | Status: DC
  Administered 2018-08-18: 20:00:00 via INTRAVENOUS

## 2018-08-18 MED ORDER — IOPAMIDOL (ISOVUE-300) INJECTION 61%
100.0000 mL | Freq: Once | INTRAVENOUS | Status: AC | PRN
  Administered 2018-08-18: 100 mL via INTRAVENOUS

## 2018-08-18 MED ORDER — ASPIRIN EC 325 MG PO TBEC
325.0000 mg | DELAYED_RELEASE_TABLET | Freq: Two times a day (BID) | ORAL | Status: DC
  Administered 2018-08-18: 325 mg via ORAL
  Filled 2018-08-18: qty 1

## 2018-08-18 MED ORDER — SODIUM CHLORIDE 0.9 % IJ SOLN
INTRAMUSCULAR | Status: AC
  Filled 2018-08-18: qty 50

## 2018-08-18 MED ORDER — SODIUM CHLORIDE 0.9 % IV SOLN
2.0000 g | INTRAVENOUS | Status: DC
  Administered 2018-08-19 – 2018-08-20 (×2): 2 g via INTRAVENOUS
  Filled 2018-08-18: qty 2
  Filled 2018-08-18: qty 20
  Filled 2018-08-18: qty 2

## 2018-08-18 MED ORDER — SODIUM CHLORIDE 0.9 % IV BOLUS
1000.0000 mL | Freq: Once | INTRAVENOUS | Status: AC
  Administered 2018-08-18: 1000 mL via INTRAVENOUS

## 2018-08-18 MED ORDER — IOPAMIDOL (ISOVUE-300) INJECTION 61%
INTRAVENOUS | Status: AC
  Filled 2018-08-18: qty 100

## 2018-08-18 MED ORDER — ONDANSETRON HCL 4 MG/2ML IJ SOLN: 4.0000 mg | Freq: Four times a day (QID) | INTRAMUSCULAR | Status: DC | PRN

## 2018-08-18 MED ORDER — ONDANSETRON HCL 4 MG PO TABS: 4.0000 mg | ORAL_TABLET | Freq: Four times a day (QID) | ORAL | Status: DC | PRN

## 2018-08-18 NOTE — H&P (Signed)
TRH H&P    Patient Demographics:    Hailey Holmes, is a 67 y.o. female  MRN: [id number]  DOB - Dec 02, 1950  Admit Date - 08/18/2018  Referring MD/NP/PA: Pryor Curia  Outpatient Primary MD for the patient is Rozetta Nunnery, Cleone Slim., MD  Patient coming from: Home  Chief complaint-fever, constipation   HPI:    Hailey Holmes  is a 67 y.o. female, with recent hip arthroplasty on 929 by Dr. Ricki Rodriguez, who was discharged from hospital on 08/16/2018 came to ED with complaint of fever and constipation.  Patient says that she has not had BM since Monday the day of her surgery.  Patient was discharged on Dilaudid every 6 hours which she has been taking scheduled.  Patient started having left lower quadrant pain and had nausea and vomiting this morning.  Patient also developed fever.  She reports temperature 100.5 at home.  She denies chest pain or shortness of breath.  Denies passing out.  No blurred vision.  In the ED CT scan of the abdomen and pelvis showed nonspecific colitis involving distal descending and proximal sigmoid segments. Patient started on ceftriaxone and Flagyl. She also received Fleet enema in the ED with good results.  Patient had large BM in the ED. She denies dysuria, urgency or frequency of urination. Denies previous history of stroke or seizures.   Review of systems:      All other systems reviewed and are negative.   With Past History of the following :    Past Medical History:  Diagnosis Date  . Arthritis   . Hypothyroidism   . Seasonal allergies   . Seasonal asthma       Past Surgical History:  Procedure Laterality Date  . DILATION AND CURETTAGE OF UTERUS     indication  unterine polyp   . MENISCECTOMY Right   . TONSILLECTOMY    . TOTAL HIP ARTHROPLASTY Right 08/13/2018   Procedure: RIGHT TOTAL HIP ARTHROPLASTY ANTERIOR APPROACH;  Surgeon: Gaynelle Arabian, MD;  Location: [facility];  Service:  Orthopedics;  Laterality: Right;  153mn  . TUBAL LIGATION    . WRIST SURGERY Right       Social History:      Social History   Tobacco Use  . Smoking status: Never Smoker  . Smokeless tobacco: Never Used  Substance Use Topics  . Alcohol use: Yes    Comment: occ when travelling        Family History :   Patient's father had prostate cancer, mother had stroke   Home Medications:   Prior to Admission medications   Medication Sig Start Date End Date Taking? Authorizing Provider  aspirin 325 MG EC tablet Take 1 tablet (325 mg total) by mouth 2 (two) times daily for 18 days. Take one tablet (325 mg) Aspirin two times a day for three weeks following surgery. Then take one baby Aspirin (81 mg) once a day for three weeks. Then discontinue aspirin. 08/16/18 09/03/18 Yes Edmisten, Kristie L, PA  Bilberry 1000 MG CAPS Take 1,000 mg by mouth daily.  Yes [provider]  bisacodyl (DULCOLAX) 5 MG EC tablet Take 15 mg by mouth daily as needed for moderate constipation.   Yes [provider]  Calcium Polycarbophil (FIBER-CAPS PO) Take 1 capsule by mouth every evening.   Yes [provider]  CALCIUM-MAGNESIUM PO Take 1-2 tablets by mouth See admin instructions. Take 1 tablet in the morning and 2 tablets in the evening   Yes [provider]  Camphor-Menthol-Methyl Sal (TIGER BALM MUSCLE RUB EX) Apply 1 application topically as needed (muscle pain).   Yes [provider]  Cholecalciferol (VITAMIN D3) 5000 units CAPS Take 5,000 Units by mouth See admin instructions. Take 5000 units once daily except sundays   Yes [provider]  fexofenadine (ALLEGRA) 180 MG tablet Take 180 mg by mouth daily.   Yes [provider]  Ginkgo Biloba 120 MG CAPS Take 120 mg by mouth daily.   Yes [provider]  GINSENG PO Take 550 mg by mouth See admin instructions. Take 550 mg by mouth once daily for 2 weeks out of every month   Yes [provider]  Histamine Dihydrochloride (AUSTRALIAN DREAM ARTHRITIS EX) Apply 1 application topically as needed (arthritis).   Yes [provider]  HYDROmorphone (DILAUDID) 2 MG tablet Take 1-2 tablets (2-4 mg total) by mouth every 6 (six) hours as needed for moderate pain. 08/16/18  Yes Edmisten, Kristie L, PA  levothyroxine (SYNTHROID, LEVOTHROID) 100 MCG tablet Take 100 mcg by mouth daily before breakfast.   Yes [provider]  Manganese 10 MG TABS Take 10 mg by mouth every evening.   Yes [provider]  Menthol, Topical Analgesic, (BIOFREEZE EX) Apply 1 application topically as needed (muscle pain).   Yes [provider]  methocarbamol (ROBAXIN) 500 MG tablet Take 1 tablet (500 mg total) by mouth every 6 (six) hours as needed for muscle spasms. 08/16/18  Yes Edmisten, Kristie L, PA  Misc Natural Products (TART CHERRY ADVANCED PO) Take 1,200 mg by mouth 2 (two) times daily.   Yes [provider]  Multiple Vitamin (MULTIVITAMIN WITH MINERALS) TABS tablet Take 1 tablet by mouth daily.   Yes [provider]  Omega-3 Fatty Acids (FISH OIL PO) Take 1,280 mg by mouth 2 (two) times daily.   Yes [provider]  OVER THE COUNTER MEDICATION Take 1 tablet by mouth daily with lunch. DGL Liquorice otc supplement   Yes [provider]  OVER THE COUNTER MEDICATION Take 1 tablet by mouth daily. Leg Veins otc supplement   Yes [provider]  OVER THE COUNTER MEDICATION Take 1 tablet by mouth 2 (two) times daily. Joint Soother otc supplement   Yes [provider]  OVER THE COUNTER MEDICATION Take 1 tablet by mouth 2 (two) times daily. Flexiaid otc supplement   Yes [provider]  OVER THE COUNTER MEDICATION Take 1 tablet by mouth every evening. Bladder Support otc supplement   Yes [provider]  OVER THE COUNTER MEDICATION Take 1 tablet by mouth every evening. Tri-chromium otc supplement   Yes [provider]  OVER THE COUNTER MEDICATION Place 5 tablets under the tongue daily. aesculus hippocastanum pellets   Yes [provider]  SOY ISOFLAVONES PO Take 60 mg by mouth every evening.   Yes [provider]  Specialty Vitamins Products (COLLAGEN ULTRA) CAPS Take 1 capsule by mouth 2 (two) times daily.   Yes [provider]  traMADol (ULTRAM) 50 MG tablet Take 50  mg by mouth 4 (four) times daily as needed for moderate pain or severe pain.   Yes [provider]  VALERIAN PO Take 110 mg by mouth at bedtime.   Yes [provider]  Vitamin D, Ergocalciferol, (DRISDOL) 50000 units CAPS capsule Take 50,000 Units by mouth every Sunday.   Yes [provider]     Allergies:     Allergies  Allergen Reactions  . Augmentin [Amoxicillin-Pot Clavulanate] Diarrhea  . Codeine Nausea Only    Dizziness  . Penicillins Diarrhea    Has patient had a PCN reaction causing immediate rash, facial/tongue/throat swelling, SOB or lightheadedness with hypotension: No Has patient had a PCN reaction causing severe rash involving mucus membranes or skin necrosis: No Has patient had a PCN reaction that required hospitalization: No Has patient had a PCN reaction occurring within the last 10 years: Yes If all of the above answers are "NO", then may proceed with Cephalosporin use.   . Erythromycin Rash     Physical Exam:   Vitals  Blood pressure (!) 105/59, pulse (!) 102, temperature (!) 102 F (38.9 C), temperature source Rectal, resp. rate 16, height 5' 4"  (1.626 m), weight 96.2 kg, SpO2 96 %.  1.  General: Appears in no acute distress  2. Psychiatric:  Intact judgement and  insight, awake alert, oriented x 3.  3. Neurologic: No focal neurological deficits, all cranial nerves intact.Strength 5/5 all 4 extremities, sensation intact all 4 extremities, plantars down going.  4. Eyes :  anicteric sclerae, moist conjunctivae with no lid lag.  PERRLA.  5. ENMT:  Oropharynx clear with moist mucous membranes and good dentition  6. Neck:  supple, no cervical lymphadenopathy appriciated, No thyromegaly  7. Respiratory : Normal respiratory effort, good air movement bilaterally,clear to  auscultation bilaterally  8. Cardiovascular : RRR, no gallops, rubs or murmurs, no leg edema  9. Gastrointestinal:  Positive bowel sounds, abdomen soft, tenderness noted at left lower quadrant  10. Skin:  No cyanosis, normal texture and turgor, no rash, lesions or ulcers  11.Musculoskeletal:  Good muscle tone,  joints appear normal , no effusions,  normal range of motion    Data Review:    CBC Recent Labs  Lab 08/14/18 0520 08/15/18 0404 08/16/18 0434 08/18/18 0759  WBC 13.4* 13.0* 8.6 13.4*  HGB 12.5 12.0 11.4* 12.7  HCT 38.6 37.4 34.9* 37.8  PLT 295 287 258 263  MCV 92.6 92.6 94.3 90.0  MCH 30.0 29.7 30.8 30.2  MCHC 32.4 32.1 32.7 33.6  RDW 15.1 15.5 16.0* 14.8  LYMPHSABS  --   --   --  0.7  MONOABS  --   --   --  1.4*  EOSABS  --   --   --  0.0  BASOSABS  --   --   --  0.0   ------------------------------------------------------------------------------------------------------------------  Results for orders placed or performed during the hospital encounter of 08/18/18 (from the past 48 hour(s))  CBG monitoring, ED     Status: Abnormal   Collection Time: 08/18/18  7:25 AM  Result Value Ref Range   Glucose-Capillary 153 (H) 70 - 99 mg/dL  CBC with Differential/Platelet     Status: Abnormal   Collection Time: 08/18/18  7:59 AM  Result Value Ref Range   WBC 13.4 (H) 4.0 - 10.5 K/uL   RBC 4.20 3.87 - 5.11 MIL/uL   Hemoglobin 12.7 12.0 - 15.0 g/dL   HCT 37.8 36.0 - 46.0 %   MCV 90.0  78.0 - 100.0 fL   MCH 30.2 26.0 - 34.0 pg   MCHC 33.6 30.0 - 36.0 g/dL   RDW 14.8 11.5 - 15.5 %   Platelets 263 150 - 400 K/uL   Neutrophils Relative % 85 %   Neutro Abs 11.3 (H) 1.7 - 7.7 K/uL   Lymphocytes Relative 5 %   Lymphs Abs  0.7 0.7 - 4.0 K/uL   Monocytes Relative 10 %   Monocytes Absolute 1.4 (H) 0.1 - 1.0 K/uL   Eosinophils Relative 0 %   Eosinophils Absolute 0.0 0.0 - 0.7 K/uL   Basophils Relative 0 %   Basophils Absolute 0.0 0.0 - 0.1 K/uL    Comment: Performed at [facility], [place] [address], [place], [place] [address]  Basic metabolic panel     Status: Abnormal   Collection Time: 08/18/18  7:59 AM  Result Value Ref Range   Sodium 139 135 - 145 mmol/L   Potassium 3.6 3.5 - 5.1 mmol/L   Chloride 103 98 - 111 mmol/L   CO2 25 22 - 32 mmol/L   Glucose, Bld 140 (H) 70 - 99 mg/dL   BUN 9 8 - 23 mg/dL   Creatinine, Ser 0.60 0.44 - 1.00 mg/dL   Calcium 9.1 8.9 - 10.3 mg/dL   GFR calc non Af Amer >60 >60 mL/min   GFR calc Af Amer >60 >60 mL/min    Comment: (NOTE) The eGFR has been calculated using the CKD EPI equation. This calculation has not been validated in all clinical situations. eGFR's persistently <60 mL/min signify possible Chronic Kidney Disease.    Anion gap 11 5 - 15    Comment: Performed at [facility], [place] [address] [place], [place] [address]  Lactic acid, plasma     Status: None   Collection Time: 08/18/18 10:47 AM  Result Value Ref Range   Lactic Acid, Venous 1.1 0.5 - 1.9 mmol/L    Comment: Performed at [facility], [place] [address] [place], [place] [address]  Urinalysis, Routine w reflex microscopic     Status: Abnormal   Collection Time: 08/18/18 11:41 AM  Result Value Ref Range   Color, Urine YELLOW YELLOW   APPearance CLEAR CLEAR   Specific Gravity, Urine 1.010 1.005 - 1.030   pH 6.0 5.0 - 8.0   Glucose, UA NEGATIVE NEGATIVE mg/dL   Hgb urine dipstick MODERATE (A) NEGATIVE   Bilirubin Urine NEGATIVE NEGATIVE   Ketones, ur NEGATIVE NEGATIVE mg/dL   Protein, ur NEGATIVE NEGATIVE mg/dL   Nitrite NEGATIVE NEGATIVE   Leukocytes, UA TRACE (A) NEGATIVE   RBC / HPF 0-5 0 - 5 RBC/hpf   WBC, UA 0-5 0 - 5 WBC/hpf    Bacteria, UA FEW (A) NONE SEEN   Squamous Epithelial / LPF 0-5 0 - 5   Mucus PRESENT    Hyaline Casts, UA PRESENT     Comment: Performed at [facility], [place] [address] [place], [place] [address]    Chemistries  Recent Labs  Lab 08/14/18 [phone] 08/15/18 0404 08/18/18 0759  NA 143 142 139  K 4.6 4.3 3.6  CL 110 108 103  CO2 27 26 25   GLUCOSE 154* 121* 140*  BUN 10 11 9   CREATININE 0.62 0.57 0.60  CALCIUM 9.2 9.2 9.1   ------------------------------------------------------------------------------------------------------------------  ------------------------------------------------------------------------------------------------------------------ GFR: Estimated Creatinine Clearance: 74.7 mL/min (by C-G formula based on SCr of 0.6 mg/dL). Liver Function Tests: No results for input(s): AST, ALT, ALKPHOS,  BILITOT, PROT, ALBUMIN in the last 168 hours. No results for input(s): LIPASE, AMYLASE in the last 168 hours. No results for input(s): AMMONIA in the last 168 hours. Coagulation Profile: No results for input(s): INR, PROTIME in the last 168 hours. Cardiac Enzymes: No results for input(s): CKTOTAL, CKMB, CKMBINDEX, TROPONINI in the last 168 hours. BNP (last 3 results) No results for input(s): PROBNP in the last 8760 hours. HbA1C: No results for input(s): HGBA1C in the last 72 hours. CBG: Recent Labs  Lab 08/16/18 1148 08/18/18 0725  GLUCAP 90 153*   Lipid Profile: No results for input(s): CHOL, HDL, LDLCALC, TRIG, CHOLHDL, LDLDIRECT in the last 72 hours. Thyroid Function Tests: No results for input(s): TSH, T4TOTAL, FREET4, T3FREE, THYROIDAB in the last 72 hours. Anemia Panel: No results for input(s): VITAMINB12, FOLATE, FERRITIN, TIBC, IRON, RETICCTPCT in the last 72 hours.  --------------------------------------------------------------------------------------------------------------- Urine analysis:    Component Value Date/Time   COLORURINE  YELLOW 08/18/2018 [place] 08/18/2018 1141   LABSPEC 1.010 08/18/2018 1141   PHURINE 6.0 08/18/2018 1141   GLUCOSEU NEGATIVE 08/18/2018 1141   HGBUR MODERATE (A) 08/18/2018 1141   BILIRUBINUR NEGATIVE 08/18/2018 1141   KETONESUR NEGATIVE 08/18/2018 1141   PROTEINUR NEGATIVE 08/18/2018 1141   NITRITE NEGATIVE 08/18/2018 1141   LEUKOCYTESUR TRACE (A) 08/18/2018 1141      Imaging Results:    Dg Chest 2 View  Result Date: 08/18/2018 CLINICAL DATA:  Recent right hip replacement.  Postop fever. EXAM: CHEST - 2 VIEW COMPARISON:  None. FINDINGS: Heart and mediastinal contours are within normal limits. No focal opacities or effusions. No acute bony abnormality. IMPRESSION: No active cardiopulmonary disease. Electronically Signed   By: Rolm Baptise M.D.   On: 08/18/2018 11:16   Dg Abdomen 1 View  Result Date: 08/18/2018 CLINICAL DATA:  67 year old female with constipation after hip surgery 6 days ago. EXAM: ABDOMEN - 1 VIEW COMPARISON:  Postoperative pelvis radiograph 08/13/2018. FINDINGS: Supine AP view at 821 hours. Gas-filled nondilated small bowel in the left abdomen and large bowel in the right and transverse colon. Retained stool in the transverse colon. Little retained stool distally. Visible abdominal and pelvic contours are within normal limits. Tubal ligation clips. Degenerative sclerosis at the pubic symphysis and bilateral SI joints. L4-L5 lumbar degeneration. Partially visible bipolar right hip arthroplasty. IMPRESSION: 1. Gas-filled but nondilated small and large bowel suggesting a degree of ileus. There is retained stool throughout the transverse colon. 2. Lower lumbar spine and pelvic degenerative osseous changes. Partially visible right hip arthroplasty. Electronically Signed   By: Genevie Ann M.D.   On: 08/18/2018 08:36   Ct Abdomen Pelvis W Contrast  Result Date: 08/18/2018 CLINICAL DATA:  has not had a BM since Monday. Pt states that she threw up yesterday, and has  not had an appetite since then. Pt states she had increased thirst. Pt states that her temperature read 100.5 at home. Pt states she had hip surgery on Monday, and has been taking dilaudid for pain. EXAM: CT ABDOMEN AND PELVIS WITH CONTRAST TECHNIQUE: Multidetector CT imaging of the abdomen and pelvis was performed using the standard protocol following bolus administration of intravenous contrast. CONTRAST:  174m ISOVUE-300 IOPAMIDOL (ISOVUE-300) INJECTION 61% COMPARISON:  None. FINDINGS: Lower chest: No acute abnormality. Hepatobiliary: Hepatic cysts, largest 6.7 cm in segment 2. 2.5 cm partially calcified stone in the dependent aspect of the nondilated gallbladder. No biliary ductal dilatation. Pancreas: Unremarkable. No pancreatic ductal dilatation or surrounding inflammatory changes. Spleen: Normal in  size without focal abnormality. Adrenals/Urinary Tract: Normal adrenal glands. 1.7 cm probable cyst in the upper pole left kidney. No hydronephrosis or evident urolithiasis. Urinary bladder is physiologically distended, containing a small amount of gas presumably from recent instrumentation. Stomach/Bowel: Tiny hiatal hernia. Stomach is nondistended. Small bowel decompressed. Appendix not discretely identified. Moderate proximal colonic fecal material. There is circumferential wall thickening in the distal descending colon extending through the proximal sigmoid segment, with mild adjacent inflammatory/edematous change. No extraluminal gas or abscess. The distal sigmoid and rectum are nondistended, unremarkable. No significant colonic diverticular disease is appreciated. Vascular/Lymphatic: No significant vascular findings are present. No enlarged abdominal or pelvic lymph nodes. Reproductive: Calcified degenerative uterine fibroids. Tubal ligation clips. Adnexal regions unremarkable. Other: Minimal free fluid in the cul-de-sac.  No free air. Musculoskeletal: Right hip arthroplasty hardware partially visualized.  Bilateral sacroiliitis. Osteitis pubis. Facet DJD in the lower lumbar spine. Negative for fracture or worrisome bone lesion. IMPRESSION: 1. Nonspecific colitis involving distal descending and proximal sigmoid segments. Consider gastroenterology follow-up . 2. Cholelithiasis. 3. Small hiatal hernia. 4. Benign-appearing hepatic and renal cysts. Electronically Signed   By: Lucrezia Europe M.D.   On: 08/18/2018 12:53       Assessment & Plan:    Active Problems:   Colitis   1. Colitis-patient has nonspecific colitis involving distal descending and proximal sigmoid segment.  Started on ceftriaxone and IV Flagyl.  Will start clear liquid diet.  2. Status post right hip arthroplasty-continue pain control with tramadol.  Avoid Dilaudid or morphine.  Will follow up with orthopedics as outpatient.  Patient is on aspirin 325 mg p.o. twice daily for DVT prophylaxis as per orthopedic surgery.  Will continue with aspirin  3. Cholelithiasis-seen on the CT scan abdomen.  Asymptomatic.  Follow-up general surgery as outpatient    DVT Prophylaxis-   Lovenox   AM Labs Ordered, also please review Full Orders  Family Communication: Admission, patients condition and plan of care including tests being ordered have been discussed with the patient and her friend at bedside who indicate understanding and agree with the plan and Code Status.  Code Status: Full code  Admission status: Observation  Time spent in minutes : 60 minutes   Oswald Hillock M.D on 08/18/2018 at 2:11 PM  Between 7am to 7pm - Pager - [phone]. After 7pm go to [email or web address] - password [email or web address]  [facility] - Office  [phone]

## 2018-08-18 NOTE — ED Triage Notes (Addendum)
Pt states that she has not had a BM since Monday. Pt states that she threw up yesterday, and has not had an appetite since then. Pt states she had increased thirst. Pt states that her temperature read 100.5 at home. Pt states she took a stool softener and ate 8 prunes yesterday. Pt states that she also tried dulcolax 3x/day without relief for several days. Pt states she had hip surgery on Monday, and has been taking dilaudid for pain.

## 2018-08-18 NOTE — ED Notes (Signed)
ED TO INPATIENT HANDOFF REPORT  Name/Age/Gender Hailey Holmes 67 y.o. female  Code Status Code Status History    Date Active Date Inactive Code Status Order ID Comments User Context   08/13/2018 1605 08/16/2018 1604 Full Code [id number]  Gaynelle Arabian, MD Inpatient    Advance Directive Documentation     Most Recent Value  Type of Advance Directive  Living will  Pre-existing out of facility DNR order (yellow form or pink MOST form)  -  "MOST" Form in Place?  -      Home/SNF/Other Home  Chief Complaint constipation;fever  Level of Care/Admitting Diagnosis ED Disposition    ED Disposition Condition [place]: [facility] [[id number]]  Level of Care: Med-Surg [16]  Diagnosis: Colitis [408144]  Admitting Physician: Oswald Hillock [[id number]]  Attending Physician: Oswald Hillock [[id number]]  PT Class (Do Not Modify): Observation [104]  PT Acc Code (Do Not Modify): Observation [10022]       Medical History Past Medical History:  Diagnosis Date  . Arthritis   . Hypothyroidism   . Seasonal allergies   . Seasonal asthma     Allergies Allergies  Allergen Reactions  . Augmentin [Amoxicillin-Pot Clavulanate] Diarrhea  . Codeine Nausea Only    Dizziness  . Penicillins Diarrhea    Has patient had a PCN reaction causing immediate rash, facial/tongue/throat swelling, SOB or lightheadedness with hypotension: No Has patient had a PCN reaction causing severe rash involving mucus membranes or skin necrosis: No Has patient had a PCN reaction that required hospitalization: No Has patient had a PCN reaction occurring within the last 10 years: Yes If all of the above answers are "NO", then may proceed with Cephalosporin use.   . Erythromycin Rash    IV Location/Drains/Wounds Patient Lines/Drains/Airways Status   Active Line/Drains/Airways    Name:   Placement date:   Placement time:   Site:   Days:   Peripheral IV 08/18/18 Right Antecubital   08/18/18     1129    Antecubital   less than 1   Incision (Closed) 08/13/18 Hip   08/13/18    1352     5   Wound / Incision (Open or Dehisced) 08/18/18 Other (Comment) Thigh Right post op from hip surgery   08/18/18    1353    Thigh   less than 1          Labs/Imaging Results for orders placed or performed during the hospital encounter of 08/18/18 (from the past 48 hour(s))  CBG monitoring, ED     Status: Abnormal   Collection Time: 08/18/18  7:25 AM  Result Value Ref Range   Glucose-Capillary 153 (H) 70 - 99 mg/dL  CBC with Differential/Platelet     Status: Abnormal   Collection Time: 08/18/18  7:59 AM  Result Value Ref Range   WBC 13.4 (H) 4.0 - 10.5 K/uL   RBC 4.20 3.87 - 5.11 MIL/uL   Hemoglobin 12.7 12.0 - 15.0 g/dL   HCT 37.8 36.0 - 46.0 %   MCV 90.0 78.0 - 100.0 fL   MCH 30.2 26.0 - 34.0 pg   MCHC 33.6 30.0 - 36.0 g/dL   RDW 14.8 11.5 - 15.5 %   Platelets 263 150 - 400 K/uL   Neutrophils Relative % 85 %   Neutro Abs 11.3 (H) 1.7 - 7.7 K/uL   Lymphocytes Relative 5 %   Lymphs Abs 0.7 0.7 - 4.0 K/uL   Monocytes Relative  10 %   Monocytes Absolute 1.4 (H) 0.1 - 1.0 K/uL   Eosinophils Relative 0 %   Eosinophils Absolute 0.0 0.0 - 0.7 K/uL   Basophils Relative 0 %   Basophils Absolute 0.0 0.0 - 0.1 K/uL    Comment: Performed at [facility], [place] [address], [place], [place] [address]  Basic metabolic panel     Status: Abnormal   Collection Time: 08/18/18  7:59 AM  Result Value Ref Range   Sodium 139 135 - 145 mmol/L   Potassium 3.6 3.5 - 5.1 mmol/L   Chloride 103 98 - 111 mmol/L   CO2 25 22 - 32 mmol/L   Glucose, Bld 140 (H) 70 - 99 mg/dL   BUN 9 8 - 23 mg/dL   Creatinine, Ser 0.60 0.44 - 1.00 mg/dL   Calcium 9.1 8.9 - 10.3 mg/dL   GFR calc non Af Amer >60 >60 mL/min   GFR calc Af Amer >60 >60 mL/min    Comment: (NOTE) The eGFR has been calculated using the CKD EPI equation. This calculation has not been validated in all clinical situations. eGFR's  persistently <60 mL/min signify possible Chronic Kidney Disease.    Anion gap 11 5 - 15    Comment: Performed at [facility], [place] [address] [place], [place] [address]  Lactic acid, plasma     Status: None   Collection Time: 08/18/18 10:47 AM  Result Value Ref Range   Lactic Acid, Venous 1.1 0.5 - 1.9 mmol/L    Comment: Performed at [facility], [place] [address] [place], [place] [address]  Urinalysis, Routine w reflex microscopic     Status: Abnormal   Collection Time: 08/18/18 11:41 AM  Result Value Ref Range   Color, Urine YELLOW YELLOW   APPearance CLEAR CLEAR   Specific Gravity, Urine 1.010 1.005 - 1.030   pH 6.0 5.0 - 8.0   Glucose, UA NEGATIVE NEGATIVE mg/dL   Hgb urine dipstick MODERATE (A) NEGATIVE   Bilirubin Urine NEGATIVE NEGATIVE   Ketones, ur NEGATIVE NEGATIVE mg/dL   Protein, ur NEGATIVE NEGATIVE mg/dL   Nitrite NEGATIVE NEGATIVE   Leukocytes, UA TRACE (A) NEGATIVE   RBC / HPF 0-5 0 - 5 RBC/hpf   WBC, UA 0-5 0 - 5 WBC/hpf   Bacteria, UA FEW (A) NONE SEEN   Squamous Epithelial / LPF 0-5 0 - 5   Mucus PRESENT    Hyaline Casts, UA PRESENT     Comment: Performed at [facility], [place] [address], [place], [place] [address]   Dg Chest 2 View  Result Date: 08/18/2018 CLINICAL DATA:  Recent right hip replacement.  Postop fever. EXAM: CHEST - 2 VIEW COMPARISON:  None. FINDINGS: Heart and mediastinal contours are within normal limits. No focal opacities or effusions. No acute bony abnormality. IMPRESSION: No active cardiopulmonary disease. Electronically Signed   By: Rolm Baptise M.D.   On: 08/18/2018 11:16   Dg Abdomen 1 View  Result Date: 08/18/2018 CLINICAL DATA:  67 year old female with constipation after hip surgery 6 days ago. EXAM: ABDOMEN - 1 VIEW COMPARISON:  Postoperative pelvis radiograph 08/13/2018. FINDINGS: Supine AP view at 821 hours. Gas-filled nondilated small bowel in the left abdomen and large bowel  in the right and transverse colon. Retained stool in the transverse colon. Little retained stool distally. Visible abdominal and pelvic contours are within normal limits. Tubal ligation clips. Degenerative sclerosis at the pubic symphysis and bilateral SI joints. L4-L5 lumbar  degeneration. Partially visible bipolar right hip arthroplasty. IMPRESSION: 1. Gas-filled but nondilated small and large bowel suggesting a degree of ileus. There is retained stool throughout the transverse colon. 2. Lower lumbar spine and pelvic degenerative osseous changes. Partially visible right hip arthroplasty. Electronically Signed   By: Genevie Ann M.D.   On: 08/18/2018 08:36   Ct Abdomen Pelvis W Contrast  Result Date: 08/18/2018 CLINICAL DATA:  has not had a BM since Monday. Pt states that she threw up yesterday, and has not had an appetite since then. Pt states she had increased thirst. Pt states that her temperature read 100.5 at home. Pt states she had hip surgery on Monday, and has been taking dilaudid for pain. EXAM: CT ABDOMEN AND PELVIS WITH CONTRAST TECHNIQUE: Multidetector CT imaging of the abdomen and pelvis was performed using the standard protocol following bolus administration of intravenous contrast. CONTRAST:  114m ISOVUE-300 IOPAMIDOL (ISOVUE-300) INJECTION 61% COMPARISON:  None. FINDINGS: Lower chest: No acute abnormality. Hepatobiliary: Hepatic cysts, largest 6.7 cm in segment 2. 2.5 cm partially calcified stone in the dependent aspect of the nondilated gallbladder. No biliary ductal dilatation. Pancreas: Unremarkable. No pancreatic ductal dilatation or surrounding inflammatory changes. Spleen: Normal in size without focal abnormality. Adrenals/Urinary Tract: Normal adrenal glands. 1.7 cm probable cyst in the upper pole left kidney. No hydronephrosis or evident urolithiasis. Urinary bladder is physiologically distended, containing a small amount of gas presumably from recent instrumentation. Stomach/Bowel: Tiny  hiatal hernia. Stomach is nondistended. Small bowel decompressed. Appendix not discretely identified. Moderate proximal colonic fecal material. There is circumferential wall thickening in the distal descending colon extending through the proximal sigmoid segment, with mild adjacent inflammatory/edematous change. No extraluminal gas or abscess. The distal sigmoid and rectum are nondistended, unremarkable. No significant colonic diverticular disease is appreciated. Vascular/Lymphatic: No significant vascular findings are present. No enlarged abdominal or pelvic lymph nodes. Reproductive: Calcified degenerative uterine fibroids. Tubal ligation clips. Adnexal regions unremarkable. Other: Minimal free fluid in the cul-de-sac.  No free air. Musculoskeletal: Right hip arthroplasty hardware partially visualized. Bilateral sacroiliitis. Osteitis pubis. Facet DJD in the lower lumbar spine. Negative for fracture or worrisome bone lesion. IMPRESSION: 1. Nonspecific colitis involving distal descending and proximal sigmoid segments. Consider gastroenterology follow-up . 2. Cholelithiasis. 3. Small hiatal hernia. 4. Benign-appearing hepatic and renal cysts. Electronically Signed   By: DLucrezia EuropeM.D.   On: 08/18/2018 12:53    Pending Labs Unresulted Labs (From admission, onward)    Start     Ordered   08/18/18 1047  Blood culture (routine x 2)  BLOOD CULTURE X 2,   STAT     08/18/18 1046   Signed and Held  HIV antibody (Routine Testing)  Once,   R     Signed and Held   Signed and Held  CBC  (enoxaparin (LOVENOX)    CrCl >/= 30 ml/min)  Once,   R    Comments:  Baseline for enoxaparin therapy IF NOT ALREADY DRAWN.  Notify MD if PLT < 100 K.    Signed and Held   Signed and Held  Creatinine, serum  (enoxaparin (LOVENOX)    CrCl >/= 30 ml/min)  Once,   R    Comments:  Baseline for enoxaparin therapy IF NOT ALREADY DRAWN.    Signed and Held   Signed and Held  Creatinine, serum  (enoxaparin (LOVENOX)    CrCl >/= 30  ml/min)  Weekly,   R    Comments:  while on enoxaparin therapy  Signed and Held   Signed and Held  CBC  Tomorrow morning,   R     Signed and Held   Signed and Held  Comprehensive metabolic panel  Tomorrow morning,   R     Signed and Held          Vitals/Pain Today's Vitals   08/18/18 1128 08/18/18 1149 08/18/18 1330 08/18/18 1400  BP:  (!) 101/59 113/61 (!) 105/59  Pulse: (!) 115 (!) 112 98 (!) 102  Resp:  _0 Temp:      TempSrc:      SpO2: 97% 98% 97% 96%  Weight:      Height:      PainSc:        Isolation Precautions No active isolations  Medications Medications  sodium chloride 0.9 % injection (  Canceled Entry 08/18/18 1312)  cefTRIAXone (ROCEPHIN) 2 g in sodium chloride 0.9 % 100 mL IVPB (2 g Intravenous New Bag/Given 08/18/18 1359)    And  metroNIDAZOLE (FLAGYL) IVPB 500 mg (has no administration in time range)  sodium phosphate (FLEET) 7-19 GM/118ML enema 1 enema (1 enema Rectal Given 08/18/18 0853)  sodium chloride 0.9 % bolus 1,000 mL (0 mLs Intravenous Stopped 08/18/18 1229)  iopamidol (ISOVUE-300) 61 % injection 100 mL ( Intravenous Canceled Entry 08/18/18 1312)    Mobility walks

## 2018-08-18 NOTE — ED Provider Notes (Signed)
[facility]-EMERGENCY DEPT Provider Note   CSN: [id number] Arrival date & time: 08/18/18  4098     History   Chief Complaint Chief Complaint  Patient presents with  . Constipation    HPI Hailey Holmes is a 67 y.o. female.  Patient with recent history of hip arthroplasty on 9/29 by Dr. Merlyn Albert, discharged from the hospital on 08/16/2018 presents with complaint of generalized abdominal pain and constipation.  Patient states that she has not had a bowel movement since prior to her surgery.  She has been passing gas.  She has had several episodes of vomiting since yesterday.  She reports taking tramadol and Dilaudid orally for pain.  She has been taking a stool softener.  She added prunes yesterday as well as Dulcolax without bowel movement.  She reports temperature to 100.5 degrees Fahrenheit at home.  States that she has been drinking fluids.  No prior abdominal surgeries other than tubal ligation. The onset of this condition was acute. The course is constant. Aggravating factors: none. Alleviating factors: none.       Past Medical History:  Diagnosis Date  . Arthritis   . Hypothyroidism   . Seasonal allergies   . Seasonal asthma     Patient Active Problem List   Diagnosis Date Noted  . OA (osteoarthritis) of hip 08/13/2018    Past Surgical History:  Procedure Laterality Date  . DILATION AND CURETTAGE OF UTERUS     indication  unterine polyp   . MENISCECTOMY Right   . TONSILLECTOMY    . TOTAL HIP ARTHROPLASTY Right 08/13/2018   Procedure: RIGHT TOTAL HIP ARTHROPLASTY ANTERIOR APPROACH;  Surgeon: Ollen Gross, MD;  Location: [facility] ORS;  Service: Orthopedics;  Laterality: Right;   . TUBAL LIGATION    . WRIST SURGERY Right      OB History   None      Home Medications    Prior to Admission medications   Medication Sig Start Date End Date Taking? Authorizing Provider  aspirin 325 MG EC tablet Take 1 tablet (325 mg total) by mouth 2 (two)  times daily for 18 days. Take one tablet (325 mg) Aspirin two times a day for three weeks following surgery. Then take one baby Aspirin (81 mg) once a day for three weeks. Then discontinue aspirin. 08/16/18 09/03/18 Yes Edmisten, Kristie L, PA  Bilberry 1000 MG CAPS Take 1,000 mg by mouth daily.   Yes [provider]  bisacodyl (DULCOLAX) 5 MG EC tablet Take 15 mg by mouth daily as needed for moderate constipation.   Yes [provider]  Calcium Polycarbophil (FIBER-CAPS PO) Take 1 capsule by mouth every evening.   Yes [provider]  CALCIUM-MAGNESIUM PO Take 1-2 tablets by mouth See admin instructions. Take 1 tablet in the morning and 2 tablets in the evening   Yes [provider]  Camphor-Menthol-Methyl Sal (TIGER BALM MUSCLE RUB EX) Apply 1 application topically as needed (muscle pain).   Yes [provider]  Cholecalciferol (VITAMIN D3) 5000 units CAPS Take 5,000 Units by mouth See admin instructions. Take 5000 units once daily except sundays   Yes [provider]  fexofenadine (ALLEGRA) 180 MG tablet Take 180 mg by mouth daily.   Yes [provider]  Ginkgo Biloba 120 MG CAPS Take 120 mg by mouth daily.   Yes [provider]  GINSENG PO Take 550 mg by mouth See admin instructions. Take 550 mg by mouth once daily for 2 weeks  out of every month   Yes [provider]  Histamine Dihydrochloride (AUSTRALIAN DREAM ARTHRITIS EX) Apply 1 application topically as needed (arthritis).   Yes [provider]  HYDROmorphone (DILAUDID) 2 MG tablet Take 1-2 tablets (2-4 mg total) by mouth every 6 (six) hours as needed for moderate pain. 08/16/18  Yes Edmisten, Kristie L, PA  levothyroxine (SYNTHROID, LEVOTHROID) 100 MCG tablet Take 100 mcg by mouth daily before breakfast.   Yes [provider]  Manganese 10 MG TABS Take 10 mg by mouth every evening.   Yes [provider]  Menthol, Topical Analgesic,  (BIOFREEZE EX) Apply 1 application topically as needed (muscle pain).   Yes [provider]  methocarbamol (ROBAXIN) 500 MG tablet Take 1 tablet (500 mg total) by mouth every 6 (six) hours as needed for muscle spasms. 08/16/18  Yes Edmisten, Kristie L, PA  Misc Natural Products (TART CHERRY ADVANCED PO) Take 1,200 mg by mouth 2 (two) times daily.   Yes [provider]  Multiple Vitamin (MULTIVITAMIN WITH MINERALS) TABS tablet Take 1 tablet by mouth daily.   Yes [provider]  Omega-3 Fatty Acids (FISH OIL PO) Take 1,280 mg by mouth 2 (two) times daily.   Yes [provider]  OVER THE COUNTER MEDICATION Take 1 tablet by mouth daily with lunch. DGL Liquorice otc supplement   Yes [provider]  OVER THE COUNTER MEDICATION Take 1 tablet by mouth daily. Leg Veins otc supplement   Yes [provider]  OVER THE COUNTER MEDICATION Take 1 tablet by mouth 2 (two) times daily. Joint Soother otc supplement   Yes [provider]  OVER THE COUNTER MEDICATION Take 1 tablet by mouth 2 (two) times daily. Flexiaid otc supplement   Yes [provider]  OVER THE COUNTER MEDICATION Take 1 tablet by mouth every evening. Bladder Support otc supplement   Yes [provider]  OVER THE COUNTER MEDICATION Take 1 tablet by mouth every evening. Tri-chromium otc supplement   Yes [provider]  OVER THE COUNTER MEDICATION Place 5 tablets under the tongue daily. aesculus hippocastanum pellets   Yes [provider]  SOY ISOFLAVONES PO Take 60 mg by mouth every evening.   Yes [provider]  Specialty Vitamins Products (COLLAGEN ULTRA) CAPS Take 1 capsule by mouth 2 (two) times daily.   Yes [provider]  traMADol (ULTRAM) 50 MG tablet Take 50 mg by mouth 4 (four) times daily as needed for moderate pain or severe pain.   Yes [provider]  VALERIAN PO Take 110 mg by mouth at bedtime.   Yes [provider]  Vitamin D, Ergocalciferol, (DRISDOL) 50000 units CAPS capsule Take 50,000 Units by mouth every Sunday.   Yes [provider]    Family History No family history on file.  Social History Social History   Tobacco Use  . Smoking status: Never Smoker  . Smokeless tobacco: Never Used  Substance Use Topics  . Alcohol use: Yes    Comment: occ when travelling   . Drug use: Never     Allergies   Augmentin [amoxicillin-pot clavulanate]; Codeine; Penicillins; and Erythromycin   Review of Systems Review of Systems  Constitutional: Negative for fever.  HENT: Negative for rhinorrhea and sore throat.   Eyes: Negative for redness.  Respiratory: Negative for cough.   Cardiovascular: Negative for chest pain.  Gastrointestinal: Positive for abdominal pain and constipation. Negative for diarrhea, nausea and vomiting.  Genitourinary: Negative  for dysuria.  Musculoskeletal: Negative for myalgias.  Skin: Negative for rash.  Neurological: Negative for headaches.     Physical Exam Updated Vital Signs BP 103/60 (BP Location: Left Arm)   Pulse 90   Temp 99 F (37.2 C) (Oral)   Resp 16   Ht 5\' 4"  (1.626 m)   Wt 96.2 kg   SpO2 (!) 89%   BMI 36.39 kg/m   Physical Exam  Constitutional: She appears well-developed and well-nourished.  HENT:  Head: Normocephalic and atraumatic.  Eyes: Conjunctivae are normal. Right eye exhibits no discharge. Left eye exhibits no discharge.  Neck: Normal range of motion. Neck supple.  Cardiovascular: Normal rate, regular rhythm and normal heart sounds.  Pulmonary/Chest: Effort normal and breath sounds normal.  Abdominal: Soft. There is tenderness. There is no rebound and no guarding.  Mild tenderness, periumbilical and L side  Genitourinary: Rectal exam shows internal hemorrhoid. Rectal exam shows no external hemorrhoid, no tenderness and anal tone normal.  Genitourinary Comments: No fecal impaction noted.  Musculoskeletal:    Patient with reasonable range of motion in right hip.  I evaluated the patient's incision site.  No active drainage.  No significant tenderness.  No surrounding erythema or cellulitis.  Neurological: She is alert.  Skin: Skin is warm and dry.  Psychiatric: She has a normal mood and affect.  Nursing note and vitals reviewed.    ED Treatments / Results  Labs (all labs ordered are listed, but only abnormal results are displayed) Labs Reviewed  CBC WITH DIFFERENTIAL/PLATELET - Abnormal; Notable for the following components:      Result Value   WBC 13.4 (*)    Neutro Abs 11.3 (*)    Monocytes Absolute 1.4 (*)    All other components within normal limits  BASIC METABOLIC PANEL - Abnormal; Notable for the following components:   Glucose, Bld 140 (*)    All other components within normal limits  URINALYSIS, ROUTINE W REFLEX MICROSCOPIC - Abnormal; Notable for the following components:   Hgb urine dipstick MODERATE (*)    Leukocytes, UA TRACE (*)    Bacteria, UA FEW (*)    All other components within normal limits  CBG MONITORING, ED - Abnormal; Notable for the following components:   Glucose-Capillary 153 (*)    All other components within normal limits  CULTURE, BLOOD (ROUTINE X 2)  CULTURE, BLOOD (ROUTINE X 2)  LACTIC ACID, PLASMA  HIV ANTIBODY (ROUTINE TESTING W REFLEX)    EKG None  Radiology Dg Chest 2 View  Result Date: 08/18/2018 CLINICAL DATA:  Recent right hip replacement.  Postop fever. EXAM: CHEST - 2 VIEW COMPARISON:  None. FINDINGS: Heart and mediastinal contours are within normal limits. No focal opacities or effusions. No acute bony abnormality. IMPRESSION: No active cardiopulmonary disease. Electronically Signed   By: Charlett Nose M.D.   On: 08/18/2018 11:16   Dg Abdomen 1 View  Result Date: 08/18/2018 CLINICAL DATA:  67 year old female with constipation after hip surgery 6 days ago. EXAM: ABDOMEN - 1 VIEW COMPARISON:  Postoperative pelvis radiograph 08/13/2018.  FINDINGS: Supine AP view at 821 hours. Gas-filled nondilated small bowel in the left abdomen and large bowel in the right and transverse colon. Retained stool in the transverse colon. Little retained stool distally. Visible abdominal and pelvic contours are within normal limits. Tubal ligation clips. Degenerative sclerosis at the pubic symphysis and bilateral SI joints. L4-L5 lumbar degeneration. Partially visible bipolar right hip arthroplasty. IMPRESSION: 1. Gas-filled but nondilated small and large  bowel suggesting a degree of ileus. There is retained stool throughout the transverse colon. 2. Lower lumbar spine and pelvic degenerative osseous changes. Partially visible right hip arthroplasty. Electronically Signed   By: Odessa Fleming M.D.   On: 08/18/2018 08:36    Procedures Procedures (including critical care time)  Medications Ordered in ED Medications - No data to display   Initial Impression / Assessment and Plan / ED Course  I have reviewed the triage vital signs and the nursing notes.  Pertinent labs & imaging results that were available during my care of the patient were reviewed by me and considered in my medical decision making (see chart for details).     Patient seen and examined.    Vital signs reviewed and are as follows: BP 103/60 (BP Location: Left Arm)   Pulse 90   Temp 99 F (37.2 C) (Oral)   Resp 16   Ht 5\' 4"  (1.626 m)   Wt 96.2 kg   SpO2 (!) 89%   BMI 36.39 kg/m   11:19 AM Patient improved after enema.   She has rectal temp of 102 degrees Fahrenheit.  Chest x-ray ordered and is negative for pneumonia.  UA pending.   Patient discussed with and seen by Dr. Rosalia Hammers.  Abdominal CT ordered for further evaluation.  Abdominal CT demonstrates descending and sigmoid colitis.  Given leukocytosis, fever, patient will be admitted for IV antibiotics.  Patient is agreeable.  Discussed with Dr. Sharl Ma who will evaluate patient for admission.  Final Clinical Impressions(s) / ED  Diagnoses   Final diagnoses:  Colitis   Patient with colitis in setting of recent surgery.  Patient is febrile with leukocytosis.  No complicating factors noted on CT scan.  ED Discharge Orders    None       Renne Crigler, [place] 08/18/18 1557    Margarita Grizzle, MD 08/18/18 [phone]

## 2018-08-18 NOTE — ED Notes (Signed)
Pt transferred to bedside commode to assist in bowel movement

## 2018-08-18 NOTE — ED Notes (Signed)
Pt had bowel movement and then returned to bed

## 2018-08-19 DIAGNOSIS — E876 Hypokalemia: Secondary | ICD-10-CM

## 2018-08-19 DIAGNOSIS — K529 Noninfective gastroenteritis and colitis, unspecified: Secondary | ICD-10-CM | POA: Diagnosis not present

## 2018-08-19 LAB — COMPREHENSIVE METABOLIC PANEL
ALBUMIN: 2.7 g/dL — AB (ref 3.5–5.0)
ALT: 29 U/L (ref 0–44)
AST: 21 U/L (ref 15–41)
Alkaline Phosphatase: 46 U/L (ref 38–126)
Anion gap: 8 (ref 5–15)
BUN: 6 mg/dL — ABNORMAL LOW (ref 8–23)
CHLORIDE: 106 mmol/L (ref 98–111)
CO2: 27 mmol/L (ref 22–32)
Calcium: 8.7 mg/dL — ABNORMAL LOW (ref 8.9–10.3)
Creatinine, Ser: 0.57 mg/dL (ref 0.44–1.00)
GFR calc Af Amer: >60 mL/min (ref >60)
Glucose, Bld: 118 mg/dL — ABNORMAL HIGH (ref 70–99)
POTASSIUM: 3.3 mmol/L — AB (ref 3.5–5.1)
Sodium: 141 mmol/L (ref 135–145)
Total Bilirubin: 0.8 mg/dL (ref 0.3–1.2)
Total Protein: 5.2 g/dL — ABNORMAL LOW (ref 6.5–8.1)

## 2018-08-19 LAB — CBC
HCT: 32.9 % — ABNORMAL LOW (ref 36.0–46.0)
Hemoglobin: 10.8 g/dL — ABNORMAL LOW (ref 12.0–15.0)
MCH: 30.1 pg (ref 26.0–34.0)
MCHC: 32.8 g/dL (ref 30.0–36.0)
MCV: 91.6 fL (ref 78.0–100.0)
PLATELETS: 273 10*3/uL (ref 150–400)
RBC: 3.59 MIL/uL — ABNORMAL LOW (ref 3.87–5.11)
RDW: 15.2 % (ref 11.5–15.5)
WBC: 11.4 10*3/uL — ABNORMAL HIGH (ref 4.0–10.5)

## 2018-08-19 LAB — HIV ANTIBODY (ROUTINE TESTING W REFLEX): HIV SCREEN 4TH GENERATION: NONREACTIVE

## 2018-08-19 MED ORDER — POTASSIUM CHLORIDE CRYS ER 20 MEQ PO TBCR
20.0000 meq | EXTENDED_RELEASE_TABLET | Freq: Once | ORAL | Status: AC
  Administered 2018-08-19: 20 meq via ORAL
  Filled 2018-08-19: qty 1

## 2018-08-19 NOTE — Progress Notes (Signed)
[facility]  PROGRESS NOTE  Hailey Holmes ZOX:[id number] DOB: October 30, 1951 DOA: 08/18/2018 PCP: Laqueta Due., MD   Brief HPI:    67 y.o. female, with recent hip arthroplasty on 929 by Dr. Merlyn Albert, who was discharged from hospital on 08/16/2018 came to ED with complaint of fever and constipation.  Patient says that she has not had BM since Monday the day of her surgery.  Patient was discharged on Dilaudid every 6 hours which she has been taking scheduled.  Patient started having left lower quadrant pain and had nausea and vomiting this morning.  Patient also developed fever.  She reports temperature 100.5 at home.  She denies chest pain or shortness of breath.  Denies passing out.  No blurred vision.  In the ED CT scan of the abdomen and pelvis showed nonspecific colitis involving distal descending and proximal sigmoid segments. Patient started on ceftriaxone and Flagyl.   Subjective   This morning patient denies nausea vomiting.  Pain has improved.  She was started on clear liquid diet yesterday.  Wants the diet to be advanced.   Assessment/Plan:     1. Colitis-patient has nonspecific colitis involving distal descending and proximal sigmoid segment.  Started on ceftriaxone and Flagyl.  Tolerated clear liquid diet well.  Will advance diet to full liquid today.  2. Status post right hip arthroplasty-pain is controlled, continue tramadol.  3. Cholelithiasis-seen on CT abdomen.  Asymptomatic.  Will need general surgery follow-up as outpatient.  4. Hypokalemia-potassium 3.3, will replace potassium and check BMP in a.m.     CBG: Recent Labs  Lab 08/16/18 1148 08/18/18 0725  GLUCAP 90 153*    CBC: Recent Labs  Lab 08/14/18 0520 08/15/18 0404 08/16/18 0434 08/18/18 0759 08/19/18 0406  WBC 13.4* 13.0* 8.6 13.4* 11.4*  NEUTROABS  --   --   --  11.3*  --   HGB 12.5 12.0 11.4* 12.7 10.8*  HCT 38.6 37.4 34.9* 37.8 32.9*  MCV 92.6 92.6 94.3 90.0 91.6  PLT 295 287 258 263 273     Basic Metabolic Panel: Recent Labs  Lab 08/14/18 0520 08/15/18 0404 08/18/18 0759 08/19/18 0406  NA 143 142 139 141  K 4.6 4.3 3.6 3.3*  CL 110 108 103 106  CO2 27 26 25 27   GLUCOSE 154* 121* 140* 118*  BUN 10 11 9  6*  CREATININE 0.62 0.57 0.60 0.57  CALCIUM 9.2 9.2 9.1 8.7*     DVT prophylaxis: Lovenox  Code Status: Full code  Family Communication: No family at bedside  Disposition Plan: likely home when medically ready for discharge   Consultants:  None  Procedures:  None   Antibiotics:   Anti-infectives (From admission, onward)   Start     Dose/Rate Route Frequency Ordered Stop   08/19/18 0800  cefTRIAXone (ROCEPHIN) 2 g in sodium chloride 0.9 % 100 mL IVPB     2 g 200 mL/hr over 30 Minutes Intravenous Every 24 hours 08/18/18 1503     08/18/18 2200  metroNIDAZOLE (FLAGYL) IVPB 500 mg     500 mg 100 mL/hr over 60 Minutes Intravenous Every 8 hours 08/18/18 1503     08/18/18 1345  cefTRIAXone (ROCEPHIN) 2 g in sodium chloride 0.9 % 100 mL IVPB     2 g 200 mL/hr over 30 Minutes Intravenous  Once 08/18/18 1343 08/18/18 1434   08/18/18 1345  metroNIDAZOLE (FLAGYL) IVPB 500 mg     500 mg 100 mL/hr over 60 Minutes Intravenous  Once 08/18/18  1343 08/18/18 1537       Objective   Vitals:   08/18/18 1400 08/18/18 1501 08/18/18 2122 08/19/18 0531  BP: (!) 105/59 124/71 112/63 (!) 96/55  Pulse: (!) 102 (!) 107 98 70  Resp: 16 20 16 16   Temp:  98 F (36.7 C) 98.7 F (37.1 C) 98.1 F (36.7 C)  TempSrc:  Oral Oral Oral  SpO2: 96% 100% 96% 98%  Weight:      Height:        Intake/Output Summary (Last 24 hours) at 08/19/2018 1240 Last data filed at 08/19/2018 1138 Gross per 24 hour  Intake 1501.42 ml  Output 600 ml  Net 901.42 ml   Filed Weights   08/18/18 0728  Weight: 96.2 kg     Physical Examination:    General: Appears in no acute distress  Cardiovascular: S1-S2, regular  Respiratory: Clear to auscultation bilaterally  Abdomen:  Soft, very mild tenderness to palpation in left lower quadrant, no rigidity or guarding  Extremities: .  No edema of the lower extremities  Neurologic: Alert, oriented x3, no focal deficit noted.     Data Reviewed: I have personally reviewed following labs and imaging studies   No results found for this or any previous visit (from the past 240 hour(s)).   Liver Function Tests: Recent Labs  Lab 08/19/18 0406  AST 21  ALT 29  ALKPHOS 46  BILITOT 0.8  PROT 5.2*  ALBUMIN 2.7*       Studies: Dg Chest 2 View  Result Date: 08/18/2018 CLINICAL DATA:  Recent right hip replacement.  Postop fever. EXAM: CHEST - 2 VIEW COMPARISON:  None. FINDINGS: Heart and mediastinal contours are within normal limits. No focal opacities or effusions. No acute bony abnormality. IMPRESSION: No active cardiopulmonary disease. Electronically Signed   By: Charlett Nose M.D.   On: 08/18/2018 11:16   Dg Abdomen 1 View  Result Date: 08/18/2018 CLINICAL DATA:  67 year old female with constipation after hip surgery 6 days ago. EXAM: ABDOMEN - 1 VIEW COMPARISON:  Postoperative pelvis radiograph 08/13/2018. FINDINGS: Supine AP view at 821 hours. Gas-filled nondilated small bowel in the left abdomen and large bowel in the right and transverse colon. Retained stool in the transverse colon. Little retained stool distally. Visible abdominal and pelvic contours are within normal limits. Tubal ligation clips. Degenerative sclerosis at the pubic symphysis and bilateral SI joints. L4-L5 lumbar degeneration. Partially visible bipolar right hip arthroplasty. IMPRESSION: 1. Gas-filled but nondilated small and large bowel suggesting a degree of ileus. There is retained stool throughout the transverse colon. 2. Lower lumbar spine and pelvic degenerative osseous changes. Partially visible right hip arthroplasty. Electronically Signed   By: Odessa Fleming M.D.   On: 08/18/2018 08:36   Ct Abdomen Pelvis W Contrast  Result Date:  08/18/2018 CLINICAL DATA:  has not had a BM since Monday. Pt states that she threw up yesterday, and has not had an appetite since then. Pt states she had increased thirst. Pt states that her temperature read 100.5 at home. Pt states she had hip surgery on Monday, and has been taking dilaudid for pain. EXAM: CT ABDOMEN AND PELVIS WITH CONTRAST TECHNIQUE: Multidetector CT imaging of the abdomen and pelvis was performed using the standard protocol following bolus administration of intravenous contrast. CONTRAST:  ISOVUE-300 IOPAMIDOL (ISOVUE-300) INJECTION 61% COMPARISON:  None. FINDINGS: Lower chest: No acute abnormality. Hepatobiliary: Hepatic cysts, largest 6.7 cm in segment 2. 2.5 cm partially calcified stone in the dependent aspect of  the nondilated gallbladder. No biliary ductal dilatation. Pancreas: Unremarkable. No pancreatic ductal dilatation or surrounding inflammatory changes. Spleen: Normal in size without focal abnormality. Adrenals/Urinary Tract: Normal adrenal glands. 1.7 cm probable cyst in the upper pole left kidney. No hydronephrosis or evident urolithiasis. Urinary bladder is physiologically distended, containing a small amount of gas presumably from recent instrumentation. Stomach/Bowel: Tiny hiatal hernia. Stomach is nondistended. Small bowel decompressed. Appendix not discretely identified. Moderate proximal colonic fecal material. There is circumferential wall thickening in the distal descending colon extending through the proximal sigmoid segment, with mild adjacent inflammatory/edematous change. No extraluminal gas or abscess. The distal sigmoid and rectum are nondistended, unremarkable. No significant colonic diverticular disease is appreciated. Vascular/Lymphatic: No significant vascular findings are present. No enlarged abdominal or pelvic lymph nodes. Reproductive: Calcified degenerative uterine fibroids. Tubal ligation clips. Adnexal regions unremarkable. Other: Minimal free fluid  in the cul-de-sac.  No free air. Musculoskeletal: Right hip arthroplasty hardware partially visualized. Bilateral sacroiliitis. Osteitis pubis. Facet DJD in the lower lumbar spine. Negative for fracture or worrisome bone lesion. IMPRESSION: 1. Nonspecific colitis involving distal descending and proximal sigmoid segments. Consider gastroenterology follow-up . 2. Cholelithiasis. 3. Small hiatal hernia. 4. Benign-appearing hepatic and renal cysts. Electronically Signed   By: Corlis Leak M.D.   On: 08/18/2018 12:53    Scheduled Meds: . enoxaparin (LOVENOX) injection  40 mg Subcutaneous Q24H  . levothyroxine  100 mcg Oral QAC breakfast  . potassium chloride  20 mEq Oral Once      Time spent: 25 min  Meredeth Ide   [facility] Pager [phone]. If 7PM-7AM, please contact night-coverage at [email or web address], Office  [phone]  password [email or web address]  08/19/2018, 12:40 PM  LOS: 0 days

## 2018-08-20 DIAGNOSIS — K529 Noninfective gastroenteritis and colitis, unspecified: Secondary | ICD-10-CM | POA: Diagnosis not present

## 2018-08-20 LAB — CBC
HEMATOCRIT: 31.8 % — AB (ref 36.0–46.0)
Hemoglobin: 10.5 g/dL — ABNORMAL LOW (ref 12.0–15.0)
MCH: 30.4 pg (ref 26.0–34.0)
MCHC: 33 g/dL (ref 30.0–36.0)
MCV: 92.2 fL (ref 78.0–100.0)
PLATELETS: 266 10*3/uL (ref 150–400)
RBC: 3.45 MIL/uL — ABNORMAL LOW (ref 3.87–5.11)
RDW: 15.1 % (ref 11.5–15.5)
WBC: 7.9 10*3/uL (ref 4.0–10.5)

## 2018-08-20 LAB — BASIC METABOLIC PANEL
Anion gap: 8 (ref 5–15)
BUN: <5 mg/dL — ABNORMAL LOW (ref 8–23)
CALCIUM: 8.3 mg/dL — AB (ref 8.9–10.3)
CO2: 23 mmol/L (ref 22–32)
Chloride: 110 mmol/L (ref 98–111)
Creatinine, Ser: 0.47 mg/dL (ref 0.44–1.00)
GFR calc Af Amer: >60 mL/min (ref >60)
GFR calc non Af Amer: >60 mL/min (ref >60)
Glucose, Bld: 103 mg/dL — ABNORMAL HIGH (ref 70–99)
Potassium: 3.4 mmol/L — ABNORMAL LOW (ref 3.5–5.1)
Sodium: 141 mmol/L (ref 135–145)

## 2018-08-20 MED ORDER — CIPROFLOXACIN HCL 500 MG PO TABS: 500.0000 mg | ORAL_TABLET | Freq: Two times a day (BID) | ORAL | 0 refills | Status: AC

## 2018-08-20 MED ORDER — METRONIDAZOLE 500 MG PO TABS
500.0000 mg | ORAL_TABLET | Freq: Three times a day (TID) | ORAL | Status: DC
  Administered 2018-08-20: 500 mg via ORAL
  Filled 2018-08-20: qty 1

## 2018-08-20 MED ORDER — POTASSIUM CHLORIDE CRYS ER 20 MEQ PO TBCR
20.0000 meq | EXTENDED_RELEASE_TABLET | Freq: Once | ORAL | Status: AC
  Administered 2018-08-20: 20 meq via ORAL
  Filled 2018-08-20: qty 1

## 2018-08-20 MED ORDER — TRAZODONE HCL 50 MG PO TABS
50.0000 mg | ORAL_TABLET | Freq: Once | ORAL | Status: AC
  Administered 2018-08-20: 50 mg via ORAL
  Filled 2018-08-20: qty 1

## 2018-08-20 MED ORDER — METRONIDAZOLE 500 MG PO TABS: 500.0000 mg | ORAL_TABLET | Freq: Three times a day (TID) | ORAL | 0 refills | Status: AC

## 2018-08-20 NOTE — Discharge Summary (Signed)
Physician Discharge Summary   Patient ID: Hailey Holmes MRN: [id number] DOB/AGE: [birth year] 67 y.o.  Admit date: 08/13/2018 Discharge date: 08/16/2018  Primary Diagnosis: Osteoarthritis, right hip   Admission Diagnoses:  Past Medical History:  Diagnosis Date  . Arthritis   . Hypothyroidism   . Seasonal allergies   . Seasonal asthma    Discharge Diagnoses:   Principal Problem:   OA (osteoarthritis) of hip  Estimated body mass index is 36.39 kg/m as calculated from the following:   Height as of this encounter: _0  (1.626 m).   Weight as of this encounter: 96.2 kg.  Procedure:  Procedure(s) (LRB): RIGHT TOTAL HIP ARTHROPLASTY ANTERIOR APPROACH (Right)   Consults: None  HPI: Hailey Holmes is a 67 y.o. female who has advanced end-stage arthritis of their Right  hip with progressively worsening pain and dysfunction.The patient has failed nonoperative management and presents for total hip arthroplasty.   Laboratory Data: Admission on 08/13/2018, Discharged on 08/16/2018  Component Date Value Ref Range Status  . ABO/RH(D) 08/13/2018 A POS   Final  . Antibody Screen 08/13/2018 NEG   Final  . Sample Expiration 08/13/2018    Final                   Value:08/16/2018 Performed at [facility], [place] [address] [place], [place] [address]   . ABO/RH(D) 08/13/2018    Final                   Value:A POS Performed at [facility], [place] [address], [place], [place] [address]   . WBC 08/14/2018 13.4* 4.0 - 10.5 K/uL Final  . RBC 08/14/2018 4.17  3.87 - 5.11 MIL/uL Final  . Hemoglobin 08/14/2018 12.5  12.0 - 15.0 g/dL Final  . HCT 08/14/2018 38.6  36.0 - 46.0 % Final  . MCV 08/14/2018 92.6  78.0 - 100.0 fL Final  . MCH 08/14/2018 30.0  26.0 - 34.0 pg Final  . MCHC 08/14/2018 32.4  30.0 - 36.0 g/dL Final  . RDW 08/14/2018 15.1  11.5 - 15.5 % Final  . Platelets 08/14/2018 295  150 - 400 K/uL Final   Performed at [facility],  [place] [address], [place], [place] [address]  . Sodium 08/14/2018 143  135 - 145 mmol/L Final  . Potassium 08/14/2018 4.6  3.5 - 5.1 mmol/L Final  . Chloride 08/14/2018 110  98 - 111 mmol/L Final  . CO2 08/14/2018 27  22 - 32 mmol/L Final  . Glucose, Bld 08/14/2018 154* 70 - 99 mg/dL Final  . BUN 08/14/2018 10  8 - 23 mg/dL Final  . Creatinine, Ser 08/14/2018 0.62  0.44 - 1.00 mg/dL Final  . Calcium 08/14/2018 9.2  8.9 - 10.3 mg/dL Final  . GFR calc non Af Amer 08/14/2018 >60  >60 mL/min Final  . GFR calc Af Amer 08/14/2018 >60  >60 mL/min Final   Comment: (NOTE) The eGFR has been calculated using the CKD EPI equation. This calculation has not been validated in all clinical situations. eGFR's persistently <60 mL/min signify possible Chronic Kidney Disease.   Georgiann Hahn gap 08/14/2018 6  5 - 15 Final   Performed at [facility], [place] [address] [place], [place] [address]  . WBC 08/15/2018 13.0* 4.0 - 10.5 K/uL Final  . RBC 08/15/2018 4.04  3.87 - 5.11 MIL/uL Final  . Hemoglobin 08/15/2018 12.0  12.0 - 15.0 g/dL Final  . HCT 08/15/2018 37.4  36.0 - 46.0 % Final  . MCV 08/15/2018 92.6  78.0 - 100.0 fL Final  . MCH 08/15/2018 29.7  26.0 - 34.0 pg Final  . MCHC 08/15/2018 32.1  30.0 - 36.0 g/dL Final  . RDW 08/15/2018 15.5  11.5 - 15.5 % Final  . Platelets 08/15/2018 287  150 - 400 K/uL Final   Performed at [facility], [place] [address], [place], [place] [address]  . Sodium 08/15/2018 142  135 - 145 mmol/L Final  . Potassium 08/15/2018 4.3  3.5 - 5.1 mmol/L Final  . Chloride 08/15/2018 108  98 - 111 mmol/L Final  . CO2 08/15/2018 26  22 - 32 mmol/L Final  . Glucose, Bld 08/15/2018 121* 70 - 99 mg/dL Final  . BUN 08/15/2018 11  8 - 23 mg/dL Final  . Creatinine, Ser 08/15/2018 0.57  0.44 - 1.00 mg/dL Final  . Calcium 08/15/2018 9.2  8.9 - 10.3 mg/dL Final  . GFR calc non Af Amer 08/15/2018 >60  >60 mL/min Final  . GFR calc Af Amer 08/15/2018 >60  >60  mL/min Final   Comment: (NOTE) The eGFR has been calculated using the CKD EPI equation. This calculation has not been validated in all clinical situations. eGFR's persistently <60 mL/min signify possible Chronic Kidney Disease.   Georgiann Hahn gap 08/15/2018 8  5 - 15 Final   Performed at [facility], [place] [address], [place], [place] [address]  . WBC 08/16/2018 8.6  4.0 - 10.5 K/uL Final  . RBC 08/16/2018 3.70* 3.87 - 5.11 MIL/uL Final  . Hemoglobin 08/16/2018 11.4* 12.0 - 15.0 g/dL Final  . HCT 08/16/2018 34.9* 36.0 - 46.0 % Final  . MCV 08/16/2018 94.3  78.0 - 100.0 fL Final  . MCH 08/16/2018 30.8  26.0 - 34.0 pg Final  . MCHC 08/16/2018 32.7  30.0 - 36.0 g/dL Final  . RDW 08/16/2018 16.0* 11.5 - 15.5 % Final  . Platelets 08/16/2018 258  150 - 400 K/uL Final   Performed at [facility], [place] [address] [place], [place] [address]  . Glucose-Capillary 08/16/2018 90  70 - 99 mg/dL Final  Hospital Outpatient Visit on 07/31/2018  Component Date Value Ref Range Status  . MRSA, PCR 07/31/2018 NEGATIVE  NEGATIVE Final  . Staphylococcus aureus 07/31/2018 NEGATIVE  NEGATIVE Final   Comment: (NOTE) The Xpert SA Assay (FDA approved for NASAL specimens in patients 71 years of age and older), is one component of a comprehensive surveillance program. It is not intended to diagnose infection nor to guide or monitor treatment. Performed at [facility], [place] [address] [place], [place] [address]   . aPTT 07/31/2018 27  24 - 36 seconds Final   Performed at [facility], [place] [address], [place], [place] [address]  . Prothrombin Time 07/31/2018 11.8  11.4 - 15.2 seconds Final  . INR 07/31/2018 0.88   Final   Performed at [facility], [place] [address] [place],  [address]     X-Rays:Dg Chest 2 View  Result Date: 08/18/2018 CLINICAL DATA:  Recent right hip replacement.  Postop fever. EXAM: CHEST - 2  VIEW COMPARISON:  None. FINDINGS: Heart and mediastinal contours are within normal limits. No focal opacities or effusions. No acute bony abnormality. IMPRESSION: No active cardiopulmonary disease. Electronically Signed   By: Rolm Baptise M.D.   On: 08/18/2018 11:16   Dg Abdomen 1 View  Result Date: 08/18/2018 CLINICAL DATA:  67 year old female with constipation after hip surgery 6 days ago. EXAM: ABDOMEN - 1 VIEW COMPARISON:  Postoperative pelvis radiograph 08/13/2018. FINDINGS: Supine AP view at 821 hours. Gas-filled nondilated small bowel in the left abdomen and large bowel in the right and transverse colon. Retained stool in the transverse colon. Little retained stool distally. Visible abdominal and pelvic contours are within normal limits. Tubal ligation clips. Degenerative sclerosis at the pubic symphysis and bilateral SI joints. L4-L5 lumbar degeneration. Partially visible bipolar right hip arthroplasty. IMPRESSION: 1. Gas-filled but nondilated small and large bowel suggesting a degree of ileus. There is retained stool throughout the transverse colon. 2. Lower lumbar spine and pelvic degenerative osseous changes. Partially visible right hip arthroplasty. Electronically Signed   By: Genevie Ann M.D.   On: 08/18/2018 08:36   Ct Abdomen Pelvis W Contrast  Result Date: 08/18/2018 CLINICAL DATA:  has not had a BM since Monday. Pt states that she threw up yesterday, and has not had an appetite since then. Pt states she had increased thirst. Pt states that her temperature read 100.5 at home. Pt states she had hip surgery on Monday, and has been taking dilaudid for pain. EXAM: CT ABDOMEN AND PELVIS WITH CONTRAST TECHNIQUE: Multidetector CT imaging of the abdomen and pelvis was performed using the standard protocol following bolus administration of intravenous contrast. CONTRAST:  180m ISOVUE-300 IOPAMIDOL (ISOVUE-300) INJECTION 61% COMPARISON:  None. FINDINGS: Lower chest: No acute abnormality. Hepatobiliary:  Hepatic cysts, largest 6.7 cm in segment 2. 2.5 cm partially calcified stone in the dependent aspect of the nondilated gallbladder. No biliary ductal dilatation. Pancreas: Unremarkable. No pancreatic ductal dilatation or surrounding inflammatory changes. Spleen: Normal in size without focal abnormality. Adrenals/Urinary Tract: Normal adrenal glands. 1.7 cm probable cyst in the upper pole left kidney. No hydronephrosis or evident urolithiasis. Urinary bladder is physiologically distended, containing a small amount of gas presumably from recent instrumentation. Stomach/Bowel: Tiny hiatal hernia. Stomach is nondistended. Small bowel decompressed. Appendix not discretely identified. Moderate proximal colonic fecal material. There is circumferential wall thickening in the distal descending colon extending through the proximal sigmoid segment, with mild adjacent inflammatory/edematous change. No extraluminal gas or abscess. The distal sigmoid and rectum are nondistended, unremarkable. No significant colonic diverticular disease is appreciated. Vascular/Lymphatic: No significant vascular findings are present. No enlarged abdominal or pelvic lymph nodes. Reproductive: Calcified degenerative uterine fibroids. Tubal ligation clips. Adnexal regions unremarkable. Other: Minimal free fluid in the cul-de-sac.  No free air. Musculoskeletal: Right hip arthroplasty hardware partially visualized. Bilateral sacroiliitis. Osteitis pubis. Facet DJD in the lower lumbar spine. Negative for fracture or worrisome bone lesion. IMPRESSION: 1. Nonspecific colitis involving distal descending and proximal sigmoid segments. Consider gastroenterology follow-up . 2. Cholelithiasis. 3. Small hiatal hernia. 4. Benign-appearing hepatic and renal cysts. Electronically Signed   By: DLucrezia EuropeM.D.   On: 08/18/2018 12:53   Dg Pelvis Portable  Result Date: 08/13/2018 CLINICAL DATA:  Right hip replacement EXAM: PORTABLE PELVIS 1-2 VIEWS COMPARISON:   None. FINDINGS: Interval right total hip arthroplasty without failure complication. Surgical drain is present. No acute fracture or dislocation. IMPRESSION: Interval right total hip arthroplasty. Electronically Signed   By: HKathreen Devoid  On: 08/13/2018 14:28   Dg C-arm 1-60 Min-no Report  Result Date: 08/13/2018 Fluoroscopy was utilized by the requesting physician.  No radiographic interpretation.   Dg Hip Operative Unilat W Or W/o Pelvis Right  Result Date: 08/13/2018 CLINICAL DATA:  Right hip replacement. EXAM: OPERATIVE RIGHT HIP (WITH PELVIS IF PERFORMED)  1 VIEW TECHNIQUE: Fluoroscopic spot image(s) were submitted for interpretation post-operatively. COMPARISON:  10/04/2016. FINDINGS: Interval right hip prosthesis in satisfactory position and alignment. No fracture or dislocation seen on this view. IMPRESSION: Satisfactory postoperative appearance of a right hip prosthesis. Electronically Signed   By: Claudie Revering M.D.   On: 08/13/2018 13:59    EKG:No orders found for this or any previous visit.   Hospital Course: Hailey Holmes is a 67 y.o. who was admitted to [facility]. They were brought to the operating room on 08/13/2018 and underwent Procedure(s): RIGHT TOTAL HIP ARTHROPLASTY ANTERIOR APPROACH.  Patient tolerated the procedure well and was later transferred to the recovery room and then to the orthopaedic floor for postoperative care. They were given PO and IV analgesics for pain control following their surgery. They were given 24 hours of postoperative antibiotics of  Anti-infectives (From admission, onward)   Start     Dose/Rate Route Frequency Ordered Stop   08/13/18 1830  ceFAZolin (ANCEF) IVPB 2g/100 mL premix     2 g 200 mL/hr over 30 Minutes Intravenous Every 6 hours 08/13/18 1604 08/14/18 0006   08/13/18 1030  ceFAZolin (ANCEF) IVPB 2g/100 mL premix     2 g 200 mL/hr over 30 Minutes Intravenous On call to O.R. 08/13/18 1024 08/13/18 1320     and started on DVT  prophylaxis in the form of Aspirin.   PT and OT were ordered for total joint protocol. Discharge planning consulted to help with postop disposition and equipment needs. Patient had a good night on the evening of surgery. They started to get up OOB with therapy on POD #1. Hemovac drain was pulled without difficulty on day one. Continued to work with therapy into POD #2. Pt was seen during rounds on day two and was ready to go home pending progress with therapy. Dressing was changed and the incision was clean, dry, and intact with no drainage. On day 2 patient became very dizzy, clammy and blood pressure dropped while working with therapy in the AM session. 500 mL bolus ordered and patient was much improved during second session of PT. Decided to keep patient one additional night. Pt was seen during rounds on POD #3 and was ready to go home. Incision was healing well. She worked with therapy for one additional session that day and was meeting her goals. She was discharged to home later that day in stable condition.  Diet: Regular diet Activity: WBAT Follow-up: in 2 weeks with Dr. Wynelle Link Disposition: Home with HHPT Discharged Condition: stable   Discharge Instructions    Call MD / Call 911   Complete by:  As directed    If you experience chest pain or shortness of breath, CALL 911 and be transported to the hospital emergency room.  If you develope a fever above 101 F, pus (white drainage) or increased drainage or redness at the wound, or calf pain, call your surgeon's office.   Change dressing   Complete by:  As directed    Change the dressing daily with sterile 4 x 4 inch gauze dressing and paper tape.   Constipation Prevention   Complete by:  As directed    Drink plenty of fluids.  Prune juice may be helpful.  You may use a stool softener, such as Colace (over the counter) 100 mg twice a day.  Use MiraLax (over the counter) for constipation as needed.   Diet - low sodium heart healthy   Complete  by:  As directed    Discharge instructions   Complete by:  As directed    Dr. Gaynelle Arabian Total Joint Specialist [facility] [address], [place], [place] [address] [phone]  ANTERIOR APPROACH TOTAL HIP REPLACEMENT POSTOPERATIVE DIRECTIONS   Hip Rehabilitation, Guidelines Following Surgery  The results of a hip operation are greatly improved after range of motion and muscle strengthening exercises. Follow all safety measures which are given to protect your hip. If any of these exercises cause increased pain or swelling in your joint, decrease the amount until you are comfortable again. Then slowly increase the exercises. Call your caregiver if you have problems or questions.   HOME CARE INSTRUCTIONS  Remove items at home which could result in a fall. This includes throw rugs or furniture in walking pathways.  ICE to the affected hip every three hours for 30 minutes at a time and then as needed for pain and swelling.  Continue to use ice on the hip for pain and swelling from surgery. You may notice swelling that will progress down to the foot and ankle.  This is normal after surgery.  Elevate the leg when you are not up walking on it.   Continue to use the breathing machine which will help keep your temperature down.  It is common for your temperature to cycle up and down following surgery, especially at night when you are not up moving around and exerting yourself.  The breathing machine keeps your lungs expanded and your temperature down.  DIET You may resume your previous home diet once your are discharged from the hospital.  DRESSING / WOUND CARE / SHOWERING You may shower 3 days after surgery, but keep the wounds dry during showering.  You may use an occlusive plastic wrap (Press'n Seal for example), NO SOAKING/SUBMERGING IN THE BATHTUB.  If the bandage gets wet, change with a clean dry gauze.  If the incision gets wet, pat the wound dry with a clean towel. You may  start showering once you are discharged home but do not submerge the incision under water. Just pat the incision dry and apply a dry gauze dressing on daily. Change the surgical dressing daily and reapply a dry dressing each time.  ACTIVITY Walk with your walker as instructed. Use walker as long as suggested by your caregivers. Avoid periods of inactivity such as sitting longer than an hour when not asleep. This helps prevent blood clots.  You may resume a sexual relationship in one month or when given the OK by your doctor.  You may return to work once you are cleared by your doctor.  Do not drive a car for 6 weeks or until released by you surgeon.  Do not drive while taking narcotics.  WEIGHT BEARING Weight bearing as tolerated with assist device (walker, cane, etc) as directed, use it as long as suggested by your surgeon or therapist, typically at least 4-6 weeks.  POSTOPERATIVE CONSTIPATION PROTOCOL Constipation - defined medically as fewer than three stools per week and severe constipation as less than one stool per week.  One of the most common issues patients have following surgery is constipation.  Even if you have a regular bowel pattern at home, your normal regimen is likely to be disrupted due to multiple reasons following surgery.  Combination of anesthesia, postoperative narcotics, change in appetite and fluid intake all can affect your bowels.  In order to avoid complications following surgery, here are some recommendations in order to help  you during your recovery period.  Colace (docusate) - Pick up an over-the-counter form of Colace or another stool softener and take twice a day as long as you are requiring postoperative pain medications.  Take with a full glass of water daily.  If you experience loose stools or diarrhea, hold the colace until you stool forms back up.  If your symptoms do not get better within 1 week or if they get worse, check with your doctor.  Dulcolax  (bisacodyl) - Pick up over-the-counter and take as directed by the product packaging as needed to assist with the movement of your bowels.  Take with a full glass of water.  Use this product as needed if not relieved by Colace only.   MiraLax (polyethylene glycol) - Pick up over-the-counter to have on hand.  MiraLax is a solution that will increase the amount of water in your bowels to assist with bowel movements.  Take as directed and can mix with a glass of water, juice, soda, coffee, or tea.  Take if you go more than two days without a movement. Do not use MiraLax more than once per day. Call your doctor if you are still constipated or irregular after using this medication for 7 days in a row.  If you continue to have problems with postoperative constipation, please contact the office for further assistance and recommendations.  If you experience "the worst abdominal pain ever" or develop nausea or vomiting, please contact the office immediatly for further recommendations for treatment.  ITCHING  If you experience itching with your medications, try taking only a single pain pill, or even half a pain pill at a time.  You can also use Benadryl over the counter for itching or also to help with sleep.   TED HOSE STOCKINGS Wear the elastic stockings on both legs for three weeks following surgery during the day but you may remove then at night for sleeping.  MEDICATIONS See your medication summary on the "After Visit Summary" that the nursing staff will review with you prior to discharge.  You may have some home medications which will be placed on hold until you complete the course of blood thinner medication.  It is important for you to complete the blood thinner medication as prescribed by your surgeon.  Continue your approved medications as instructed at time of discharge.  PRECAUTIONS If you experience chest pain or shortness of breath - call 911 immediately for transfer to the hospital emergency  department.  If you develop a fever greater that 101 F, purulent drainage from wound, increased redness or drainage from wound, foul odor from the wound/dressing, or calf pain - CONTACT YOUR SURGEON.                                                   FOLLOW-UP APPOINTMENTS Make sure you keep all of your appointments after your operation with your surgeon and caregivers. You should call the office at the above phone number and make an appointment for approximately two weeks after the date of your surgery or on the date instructed by your surgeon outlined in the "After Visit Summary".  RANGE OF MOTION AND STRENGTHENING EXERCISES  These exercises are designed to help you keep full movement of your hip joint. Follow your caregiver's or physical therapist's instructions. Perform all exercises about fifteen times,  three times per day or as directed. Exercise both hips, even if you have had only one joint replacement. These exercises can be done on a training (exercise) mat, on the floor, on a table or on a bed. Use whatever works the best and is most comfortable for you. Use music or television while you are exercising so that the exercises are a pleasant break in your day. This will make your life better with the exercises acting as a break in routine you can look forward to.  Lying on your back, slowly slide your foot toward your buttocks, raising your knee up off the floor. Then slowly slide your foot back down until your leg is straight again.  Lying on your back spread your legs as far apart as you can without causing discomfort.  Lying on your side, raise your upper leg and foot straight up from the floor as far as is comfortable. Slowly lower the leg and repeat.  Lying on your back, tighten up the muscle in the front of your thigh (quadriceps muscles). You can do this by keeping your leg straight and trying to raise your heel off the floor. This helps strengthen the largest muscle supporting your knee.    Lying on your back, tighten up the muscles of your buttocks both with the legs straight and with the knee bent at a comfortable angle while keeping your heel on the floor.   IF YOU ARE TRANSFERRED TO A SKILLED REHAB FACILITY If the patient is transferred to a skilled rehab facility following release from the hospital, a list of the current medications will be sent to the facility for the patient to continue.  When discharged from the skilled rehab facility, please have the facility set up the patient's [place] prior to being released. Also, the skilled facility will be responsible for providing the patient with their medications at time of release from the facility to include their pain medication, the muscle relaxants, and their blood thinner medication. If the patient is still at the rehab facility at time of the two week follow up appointment, the skilled rehab facility will also need to assist the patient in arranging follow up appointment in our office and any transportation needs.  MAKE SURE YOU:  Understand these instructions.  Get help right away if you are not doing well or get worse.    Pick up stool softner and laxative for home use following surgery while on pain medications. Do not submerge incision under water. Please use good hand washing techniques while changing dressing each day. May shower starting three days after surgery. Please use a clean towel to pat the incision dry following showers. Continue to use ice for pain and swelling after surgery. Do not use any lotions or creams on the incision until instructed by your surgeon.   Do not sit on low chairs, stoools or toilet seats, as it may be difficult to get up from low surfaces   Complete by:  As directed    Driving restrictions   Complete by:  As directed    No driving for two weeks   TED hose   Complete by:  As directed    Use stockings (TED hose) for three weeks on both leg(s).  You may remove  them at night for sleeping.   Weight bearing as tolerated   Complete by:  As directed      Allergies as of 08/16/2018      Reactions  Augmentin [amoxicillin-pot Clavulanate] Diarrhea   Codeine Nausea Only   Dizziness   Penicillins Diarrhea   Has patient had a PCN reaction causing immediate rash, facial/tongue/throat swelling, SOB or lightheadedness with hypotension: No Has patient had a PCN reaction causing severe rash involving mucus membranes or skin necrosis: No Has patient had a PCN reaction that required hospitalization: No Has patient had a PCN reaction occurring within the last 10 years: Yes If all of the above answers are "NO", then may proceed with Cephalosporin use.   Erythromycin Rash      Medication List    STOP taking these medications   meloxicam 15 MG tablet Commonly known as:  [place] 500-25-15 MG Tabs Generic drug:  APAP-Pamabrom-Pyrilamine     TAKE these medications   aspirin 325 MG EC tablet Take 1 tablet (325 mg total) by mouth 2 (two) times daily for 18 days. Take one tablet (325 mg) Aspirin two times a day for three weeks following surgery. Then take one baby Aspirin (81 mg) once a day for three weeks. Then discontinue aspirin.   AUSTRALIAN DREAM ARTHRITIS EX Apply 1 application topically as needed (arthritis).   Bilberry 1000 MG Caps Take 1,000 mg by mouth daily.   BIOFREEZE EX Apply 1 application topically as needed (muscle pain).   CALCIUM-MAGNESIUM PO Take 1-2 tablets by mouth See admin instructions. Take 1 tablet in the morning and 2 tablets in the evening   COLLAGEN ULTRA Caps Take 1 capsule by mouth 2 (two) times daily.   fexofenadine 180 MG tablet Commonly known as:  ALLEGRA Take 180 mg by mouth daily.   FIBER-CAPS PO Take 1 capsule by mouth every evening.   FISH OIL PO Take 1,280 mg by mouth 2 (two) times daily.   Ginkgo Biloba 120 MG Caps Take 120 mg by mouth daily.   GINSENG PO Take 550 mg by mouth  See admin instructions. Take 550 mg by mouth once daily for 2 weeks out of every month   HYDROmorphone 2 MG tablet Commonly known as:  DILAUDID Take 1-2 tablets (2-4 mg total) by mouth every 6 (six) hours as needed for moderate pain.   levothyroxine 100 MCG tablet Commonly known as:  SYNTHROID, LEVOTHROID Take 100 mcg by mouth daily before breakfast.   Manganese 10 MG Tabs Take 10 mg by mouth every evening.   methocarbamol 500 MG tablet Commonly known as:  ROBAXIN Take 1 tablet (500 mg total) by mouth every 6 (six) hours as needed for muscle spasms.   multivitamin with minerals Tabs tablet Take 1 tablet by mouth daily.   OVER THE COUNTER MEDICATION Take 1 tablet by mouth daily with lunch. DGL Liquorice otc supplement   OVER THE COUNTER MEDICATION Take 1 tablet by mouth daily. Leg Veins otc supplement   OVER THE COUNTER MEDICATION Take 1 tablet by mouth 2 (two) times daily. Joint Soother otc supplement   OVER THE COUNTER MEDICATION Take 1 tablet by mouth 2 (two) times daily. Flexiaid otc supplement   OVER THE COUNTER MEDICATION Take 1 tablet by mouth every evening. Bladder Support otc supplement   OVER THE COUNTER MEDICATION Take 1 tablet by mouth every evening. Tri-chromium otc supplement   OVER THE COUNTER MEDICATION Place 5 tablets under the tongue daily. aesculus hippocastanum pellets   SOY ISOFLAVONES PO Take 60 mg by mouth every evening.   TART CHERRY ADVANCED PO Take 1,200 mg by mouth 2 (two) times daily.   TIGER BALM MUSCLE RUB  EX Apply 1 application topically as needed (muscle pain).   traMADol 50 MG tablet Commonly known as:  ULTRAM Take 50 mg by mouth 4 (four) times daily as needed for moderate pain or severe pain.   VALERIAN PO Take 110 mg by mouth at bedtime.   Vitamin D (Ergocalciferol) 50000 units Caps capsule Commonly known as:  DRISDOL Take 50,000 Units by mouth every _0 /02/19 0725   08/15/18 0000  Change dressing    Comments:  Change the dressing daily with sterile 4 x 4 inch gauze dressing and paper tape.   08/15/18 0725         Follow-up Information    Gaynelle Arabian, MD. Schedule an appointment as soon as possible for a visit on 08/28/2018.   Specialty:  Orthopedic Surgery Contact information: [address] [place] [address] [phone]        [facility] Follow up.   Specialty:  [place] Why:  physical therapt Contact information: [place] [address] [phone]           Signed: Theresa Duty, PA-C Orthopedic Surgery 08/20/2018, 11:29 AM

## 2018-08-20 NOTE — Discharge Summary (Signed)
Physician Discharge Summary  Hailey Holmes ZOX:[id number] DOB: 10-Jun-1951 DOA: 08/18/2018  PCP: Laqueta Due., MD  Admit date: 08/18/2018 Discharge date: 08/20/2018  Time spent: 35 minutes  Recommendations for Outpatient Follow-up:  1. Follow up PCP in 2 weeks   Discharge Diagnoses:  Active Problems:   Colitis   Discharge Condition: Stable  Diet recommendation: Regular diet  Filed Weights   08/18/18 0728  Weight: 96.2 kg    History of present illness:  67 y.o.female,with recent hip arthroplasty on 929 by Dr. Chauncy Passy was discharged from hospital on 08/16/2018 came to ED with complaint of fever and constipation. Patient says that she has not had BM since Monday the day of her surgery. Patient was discharged on Dilaudid every 6 hours which she has been taking scheduled. Patient started having left lower quadrant pain and had nausea and vomiting this morning. Patient also developed fever. She reports temperature 100.5 at home. She denies chest pain or shortness of breath. Denies passing out. No blurred vision. In the ED CT scan of the abdomen and pelvis showed nonspecific colitis involving distal descending and proximal sigmoid segments. Patient started on ceftriaxone and Flagyl.  Hospital Course:   1. Colitis-patient has nonspecific colitis involving distal descending and proximal sigmoid segment.  Started on ceftriaxone and Flagyl.  Tolerated clear liquid diet well.  Diet advanced to soft diet and tolerated it well. Will discharge home on Po Cipro and Flagyl for 5 days  2. Status post right hip arthroplasty-pain is controlled, continue tramadol. D/c Dilaudid  3. Cholelithiasis-seen on CT abdomen.  Asymptomatic.  Will need general surgery follow-up as outpatient.  4. Hypokalemia-potassium 3.4, will replace potassium before dishcarge   Procedures:  None  Consultations:  None   Discharge Exam: Vitals:   08/19/18 2127 08/20/18 0550  BP: 123/62 123/60   Pulse: 92 92  Resp: 20 20  Temp: 98.1 F (36.7 C) 98.2 F (36.8 C)  SpO2: 98% 95%    General: Appears in no acute distress Cardiovascular: S1S2 RRR Respiratory: Clear bilaterally  Discharge Instructions   Discharge Instructions    Diet - low sodium heart healthy   Complete by:  As directed    Increase activity slowly   Complete by:  As directed      Allergies as of 08/20/2018      Reactions   Augmentin [amoxicillin-pot Clavulanate] Diarrhea   Codeine Nausea Only   Dizziness   Penicillins Diarrhea   Has patient had a PCN reaction causing immediate rash, facial/tongue/throat swelling, SOB or lightheadedness with hypotension: No Has patient had a PCN reaction causing severe rash involving mucus membranes or skin necrosis: No Has patient had a PCN reaction that required hospitalization: No Has patient had a PCN reaction occurring within the last 10 years: Yes If all of the above answers are "NO", then may proceed with Cephalosporin use.   Erythromycin Rash      Medication List    STOP taking these medications   HYDROmorphone 2 MG tablet Commonly known as:  DILAUDID     TAKE these medications   aspirin 325 MG EC tablet Take 1 tablet (325 mg total) by mouth 2 (two) times daily for 18 days. Take one tablet (325 mg) Aspirin two times a day for three weeks following surgery. Then take one baby Aspirin (81 mg) once a day for three weeks. Then discontinue aspirin.   AUSTRALIAN DREAM ARTHRITIS EX Apply 1 application topically as needed (arthritis).   Bilberry 1000 MG Caps Take 1,000  mg by mouth daily.   BIOFREEZE EX Apply 1 application topically as needed (muscle pain).   bisacodyl 5 MG EC tablet Commonly known as:  DULCOLAX Take 15 mg by mouth daily as needed for moderate constipation.   CALCIUM-MAGNESIUM PO Take 1-2 tablets by mouth See admin instructions. Take 1 tablet in the morning and 2 tablets in the evening   ciprofloxacin 500 MG tablet Commonly known  as:  CIPRO Take 1 tablet (500 mg total) by mouth 2 (two) times daily for 5 days.   COLLAGEN ULTRA Caps Take 1 capsule by mouth 2 (two) times daily.   fexofenadine 180 MG tablet Commonly known as:  ALLEGRA Take 180 mg by mouth daily.   FIBER-CAPS PO Take 1 capsule by mouth every evening.   FISH OIL PO Take 1,280 mg by mouth 2 (two) times daily.   Ginkgo Biloba 120 MG Caps Take 120 mg by mouth daily.   GINSENG PO Take 550 mg by mouth See admin instructions. Take 550 mg by mouth once daily for 2 weeks out of every month   levothyroxine 100 MCG tablet Commonly known as:  SYNTHROID, LEVOTHROID Take 100 mcg by mouth daily before breakfast.   Manganese 10 MG Tabs Take 10 mg by mouth every evening.   methocarbamol 500 MG tablet Commonly known as:  ROBAXIN Take 1 tablet (500 mg total) by mouth every 6 (six) hours as needed for muscle spasms.   metroNIDAZOLE 500 MG tablet Commonly known as:  FLAGYL Take 1 tablet (500 mg total) by mouth every 8 (eight) hours.   multivitamin with minerals Tabs tablet Take 1 tablet by mouth daily.   OVER THE COUNTER MEDICATION Take 1 tablet by mouth daily with lunch. DGL Liquorice otc supplement   OVER THE COUNTER MEDICATION Take 1 tablet by mouth daily. Leg Veins otc supplement   OVER THE COUNTER MEDICATION Take 1 tablet by mouth 2 (two) times daily. Joint Soother otc supplement   OVER THE COUNTER MEDICATION Take 1 tablet by mouth 2 (two) times daily. Flexiaid otc supplement   OVER THE COUNTER MEDICATION Take 1 tablet by mouth every evening. Bladder Support otc supplement   OVER THE COUNTER MEDICATION Take 1 tablet by mouth every evening. Tri-chromium otc supplement   OVER THE COUNTER MEDICATION Place 5 tablets under the tongue daily. aesculus hippocastanum pellets   SOY ISOFLAVONES PO Take 60 mg by mouth every evening.   TART CHERRY ADVANCED PO Take 1,200 mg by mouth 2 (two) times daily.   TIGER BALM MUSCLE RUB EX Apply 1  application topically as needed (muscle pain).   traMADol 50 MG tablet Commonly known as:  ULTRAM Take 50 mg by mouth 4 (four) times daily as needed for moderate pain or severe pain.   VALERIAN PO Take 110 mg by mouth at bedtime.   Vitamin D (Ergocalciferol) 50000 units Caps capsule Commonly known as:  DRISDOL Take 50,000 Units by mouth every Sunday.   Vitamin D3 5000 units Caps Take 5,000 Units by mouth See admin instructions. Take 5000 units once daily except sundays      Allergies  Allergen Reactions  . Augmentin [Amoxicillin-Pot Clavulanate] Diarrhea  . Codeine Nausea Only    Dizziness  . Penicillins Diarrhea    Has patient had a PCN reaction causing immediate rash, facial/tongue/throat swelling, SOB or lightheadedness with hypotension: No Has patient had a PCN reaction causing severe rash involving mucus membranes or skin necrosis: No Has patient had a PCN reaction that required hospitalization:  No Has patient had a PCN reaction occurring within the last 10 years: Yes If all of the above answers are "NO", then may proceed with Cephalosporin use.   . Erythromycin Rash      The results of significant diagnostics from this hospitalization (including imaging, microbiology, ancillary and laboratory) are listed below for reference.    Significant Diagnostic Studies: Dg Chest 2 View  Result Date: 08/18/2018 CLINICAL DATA:  Recent right hip replacement.  Postop fever. EXAM: CHEST - 2 VIEW COMPARISON:  None. FINDINGS: Heart and mediastinal contours are within normal limits. No focal opacities or effusions. No acute bony abnormality. IMPRESSION: No active cardiopulmonary disease. Electronically Signed   By: Charlett Nose M.D.   On: 08/18/2018 11:16   Dg Abdomen 1 View  Result Date: 08/18/2018 CLINICAL DATA:  67 year old female with constipation after hip surgery 6 days ago. EXAM: ABDOMEN - 1 VIEW COMPARISON:  Postoperative pelvis radiograph 08/13/2018. FINDINGS: Supine AP view  at 821 hours. Gas-filled nondilated small bowel in the left abdomen and large bowel in the right and transverse colon. Retained stool in the transverse colon. Little retained stool distally. Visible abdominal and pelvic contours are within normal limits. Tubal ligation clips. Degenerative sclerosis at the pubic symphysis and bilateral SI joints. L4-L5 lumbar degeneration. Partially visible bipolar right hip arthroplasty. IMPRESSION: 1. Gas-filled but nondilated small and large bowel suggesting a degree of ileus. There is retained stool throughout the transverse colon. 2. Lower lumbar spine and pelvic degenerative osseous changes. Partially visible right hip arthroplasty. Electronically Signed   By: Odessa Fleming M.D.   On: 08/18/2018 08:36   Ct Abdomen Pelvis W Contrast  Result Date: 08/18/2018 CLINICAL DATA:  has not had a BM since Monday. Pt states that she threw up yesterday, and has not had an appetite since then. Pt states she had increased thirst. Pt states that her temperature read 100.5 at home. Pt states she had hip surgery on Monday, and has been taking dilaudid for pain. EXAM: CT ABDOMEN AND PELVIS WITH CONTRAST TECHNIQUE: Multidetector CT imaging of the abdomen and pelvis was performed using the standard protocol following bolus administration of intravenous contrast. CONTRAST:  ISOVUE-300 IOPAMIDOL (ISOVUE-300) INJECTION 61% COMPARISON:  None. FINDINGS: Lower chest: No acute abnormality. Hepatobiliary: Hepatic cysts, largest 6.7 cm in segment 2. 2.5 cm partially calcified stone in the dependent aspect of the nondilated gallbladder. No biliary ductal dilatation. Pancreas: Unremarkable. No pancreatic ductal dilatation or surrounding inflammatory changes. Spleen: Normal in size without focal abnormality. Adrenals/Urinary Tract: Normal adrenal glands. 1.7 cm probable cyst in the upper pole left kidney. No hydronephrosis or evident urolithiasis. Urinary bladder is physiologically distended, containing a  small amount of gas presumably from recent instrumentation. Stomach/Bowel: Tiny hiatal hernia. Stomach is nondistended. Small bowel decompressed. Appendix not discretely identified. Moderate proximal colonic fecal material. There is circumferential wall thickening in the distal descending colon extending through the proximal sigmoid segment, with mild adjacent inflammatory/edematous change. No extraluminal gas or abscess. The distal sigmoid and rectum are nondistended, unremarkable. No significant colonic diverticular disease is appreciated. Vascular/Lymphatic: No significant vascular findings are present. No enlarged abdominal or pelvic lymph nodes. Reproductive: Calcified degenerative uterine fibroids. Tubal ligation clips. Adnexal regions unremarkable. Other: Minimal free fluid in the cul-de-sac.  No free air. Musculoskeletal: Right hip arthroplasty hardware partially visualized. Bilateral sacroiliitis. Osteitis pubis. Facet DJD in the lower lumbar spine. Negative for fracture or worrisome bone lesion. IMPRESSION: 1. Nonspecific colitis involving distal descending and proximal sigmoid segments. Consider gastroenterology  follow-up . 2. Cholelithiasis. 3. Small hiatal hernia. 4. Benign-appearing hepatic and renal cysts. Electronically Signed   By: Corlis Leak M.D.   On: 08/18/2018 12:53   Dg Pelvis Portable  Result Date: 08/13/2018 CLINICAL DATA:  Right hip replacement EXAM: PORTABLE PELVIS 1-2 VIEWS COMPARISON:  None. FINDINGS: Interval right total hip arthroplasty without failure complication. Surgical drain is present. No acute fracture or dislocation. IMPRESSION: Interval right total hip arthroplasty. Electronically Signed   By: Elige Ko   On: 08/13/2018 14:28   Dg C-arm 1-60 Min-no Report  Result Date: 08/13/2018 Fluoroscopy was utilized by the requesting physician.  No radiographic interpretation.   Dg Hip Operative Unilat W Or W/o Pelvis Right  Result Date: 08/13/2018 CLINICAL DATA:  Right hip  replacement. EXAM: OPERATIVE RIGHT HIP (WITH PELVIS IF PERFORMED) 1 VIEW TECHNIQUE: Fluoroscopic spot image(s) were submitted for interpretation post-operatively. COMPARISON:  10/04/2016. FINDINGS: Interval right hip prosthesis in satisfactory position and alignment. No fracture or dislocation seen on this view. IMPRESSION: Satisfactory postoperative appearance of a right hip prosthesis. Electronically Signed   By: Beckie Salts M.D.   On: 08/13/2018 13:59    Microbiology: Recent Results (from the past 240 hour(s))  Blood culture (routine x 2)     Status: None (Preliminary result)   Collection Time: 08/18/18 10:47 AM  Result Value Ref Range Status   Specimen Description   Final    BLOOD RIGHT ANTECUBITAL Performed at [facility], [address] [place], [place] [address]    Special Requests   Final    BOTTLES DRAWN AEROBIC AND ANAEROBIC Blood Culture results may not be optimal due to an excessive volume of blood received in culture bottles Performed at [facility] [address] [place], [place] [address]    Culture   Final    NO GROWTH 2 DAYS Performed at [facility], [address] [place], [place] [address]    Report Status PENDING  Incomplete  Blood culture (routine x 2)     Status: None (Preliminary result)   Collection Time: 08/18/18 10:52 AM  Result Value Ref Range Status   Specimen Description   Final    BLOOD RIGHT HAND Performed at [facility], [address], [place], [place] [address]    Special Requests   Final    BOTTLES DRAWN AEROBIC AND ANAEROBIC Blood Culture results may not be optimal due to an inadequate volume of blood received in culture bottles Performed at [facility], [address], [place], [place] [address]    Culture   Final    NO GROWTH 2 DAYS Performed at [facility], [address], [place], [place] [address]    Report Status PENDING  Incomplete      Labs: Basic Metabolic Panel: Recent Labs  Lab 08/14/18 0520 08/15/18 0404 08/18/18 0759 08/19/18 0406 08/20/18 0408  NA 143 142 139 141 141  K 4.6 4.3 3.6 3.3* 3.4*  CL 110 108 103 106 110  CO2 27 26 25 27 23   GLUCOSE 154* 121* 140* 118* 103*  BUN 10 11 9  6* <5*  CREATININE 0.62 0.57 0.60 0.57 0.47  CALCIUM 9.2 9.2 9.1 8.7* 8.3*   Liver Function Tests: Recent Labs  Lab 08/19/18 0406  AST 21  ALT 29  ALKPHOS 46  BILITOT 0.8  PROT 5.2*  ALBUMIN 2.7*   No results for input(s): LIPASE, AMYLASE in the last 168 hours. No results for  input(s): AMMONIA in the last 168 hours. CBC: Recent Labs  Lab 08/15/18 0404 08/16/18 0434 08/18/18 0759 08/19/18 0406 08/20/18 0408  WBC 13.0* 8.6 13.4* 11.4* 7.9  NEUTROABS  --   --  11.3*  --   --   HGB 12.0 11.4* 12.7 10.8* 10.5*  HCT 37.4 34.9* 37.8 32.9* 31.8*  MCV 92.6 94.3 90.0 91.6 92.2  PLT 287 258 263 273 266    CBG: Recent Labs  Lab 08/16/18 1148 08/18/18 0725  GLUCAP 90 153*       Signed:  Meredeth Ide MD.  [facility] 08/20/2018, 2:18 PM

## 2018-08-20 NOTE — Progress Notes (Signed)
Pt was discharged home today. Instructions were reviewed with patient, and questions were answered. Pt was taken to main entrance via wheelchair by NT.  

## 2018-08-23 LAB — CULTURE, BLOOD (ROUTINE X 2)
CULTURE: NO GROWTH
Culture: NO GROWTH

## 2019-07-25 IMAGING — DX DG PORTABLE PELVIS
1 series · 1 of 1 positions shown · non-contrast
Comparison: None.

CLINICAL DATA: Right hip replacement

EXAM:
PORTABLE PELVIS 1-2 VIEWS

[pelvis ap]
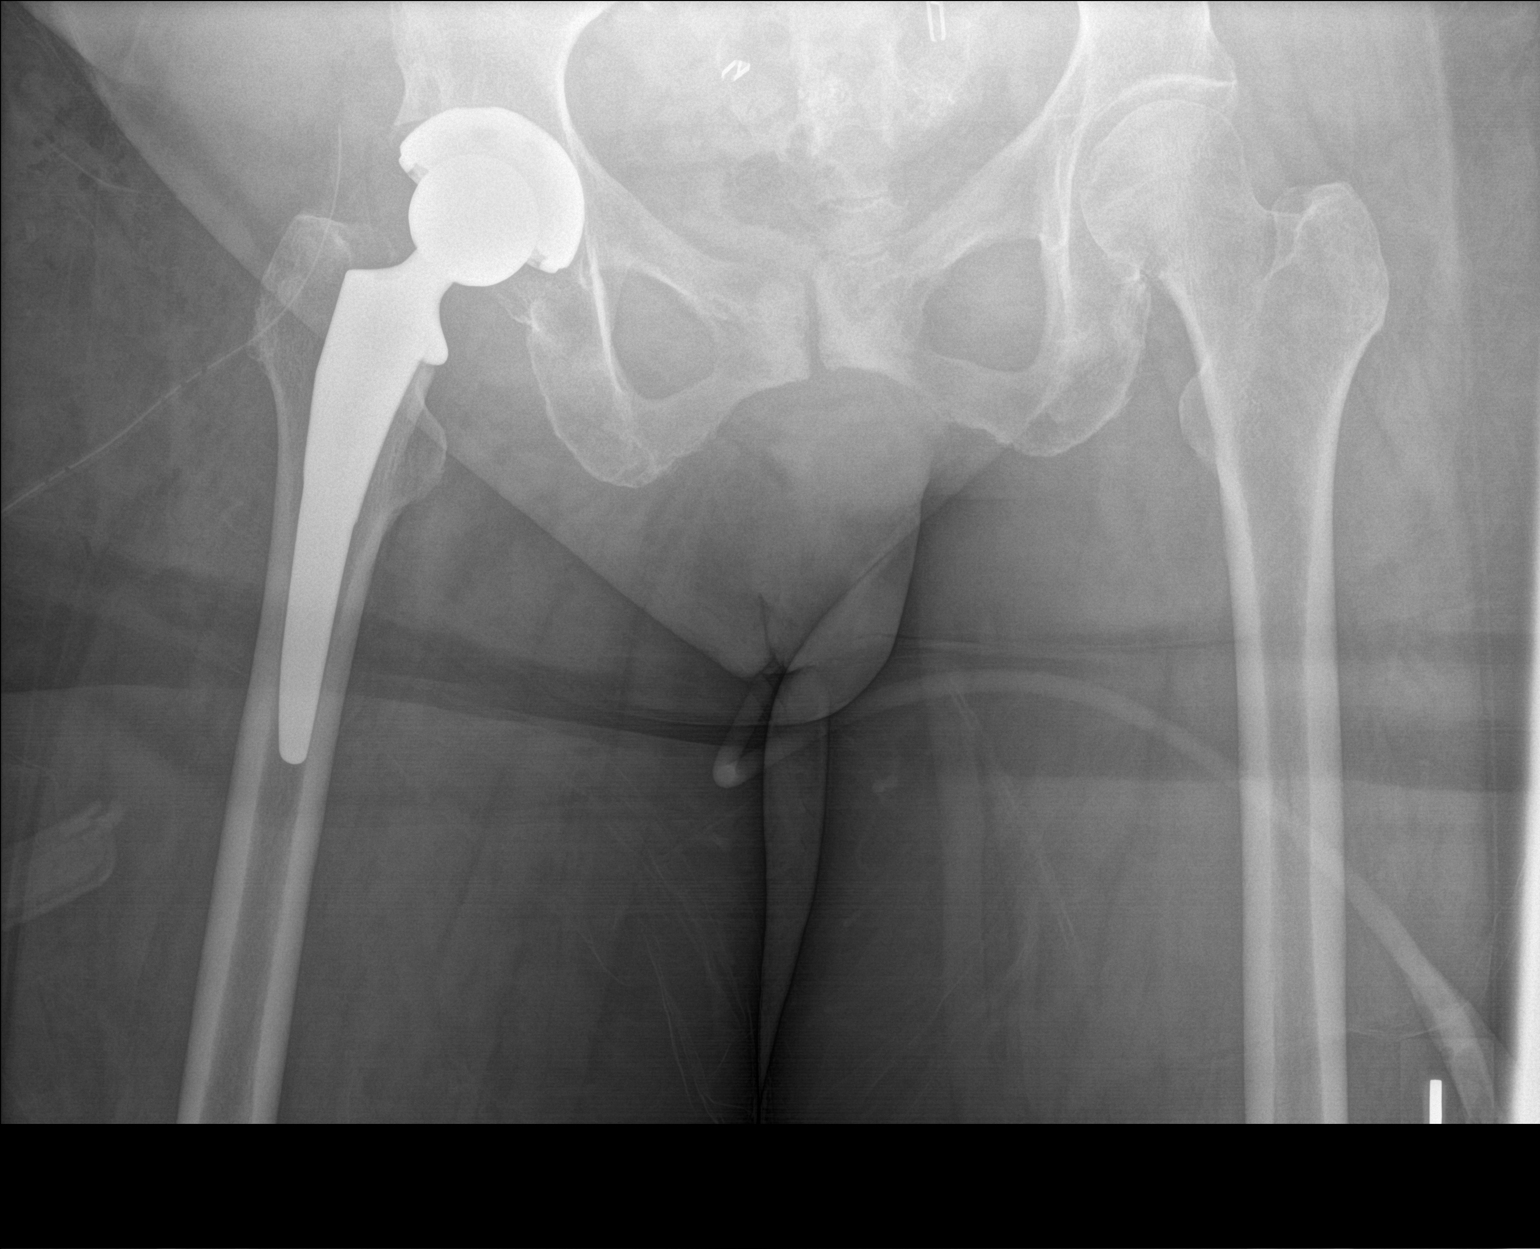

[1 of 1 positions shown; findings below may reference images not displayed]

FINDINGS: Interval right total hip arthroplasty without failure complication.
Surgical drain is present. No acute fracture or dislocation.
IMPRESSION: Interval right total hip arthroplasty.

## 2019-07-25 IMAGING — RF DG HIP (WITH PELVIS) OPERATIVE*R*
1 series · 1 of 1 positions shown · non-contrast
Comparison: 10/04/2016.

CLINICAL DATA: Right hip replacement.

EXAM:
OPERATIVE RIGHT HIP (WITH PELVIS IF PERFORMED) 1 VIEW
TECHNIQUE: Fluoroscopic spot image(s) were submitted for interpretation
post-operatively.

[Series 1: unknown protocol · 0.20mm/px · 1 of 1 slices shown]
[im 1/1]
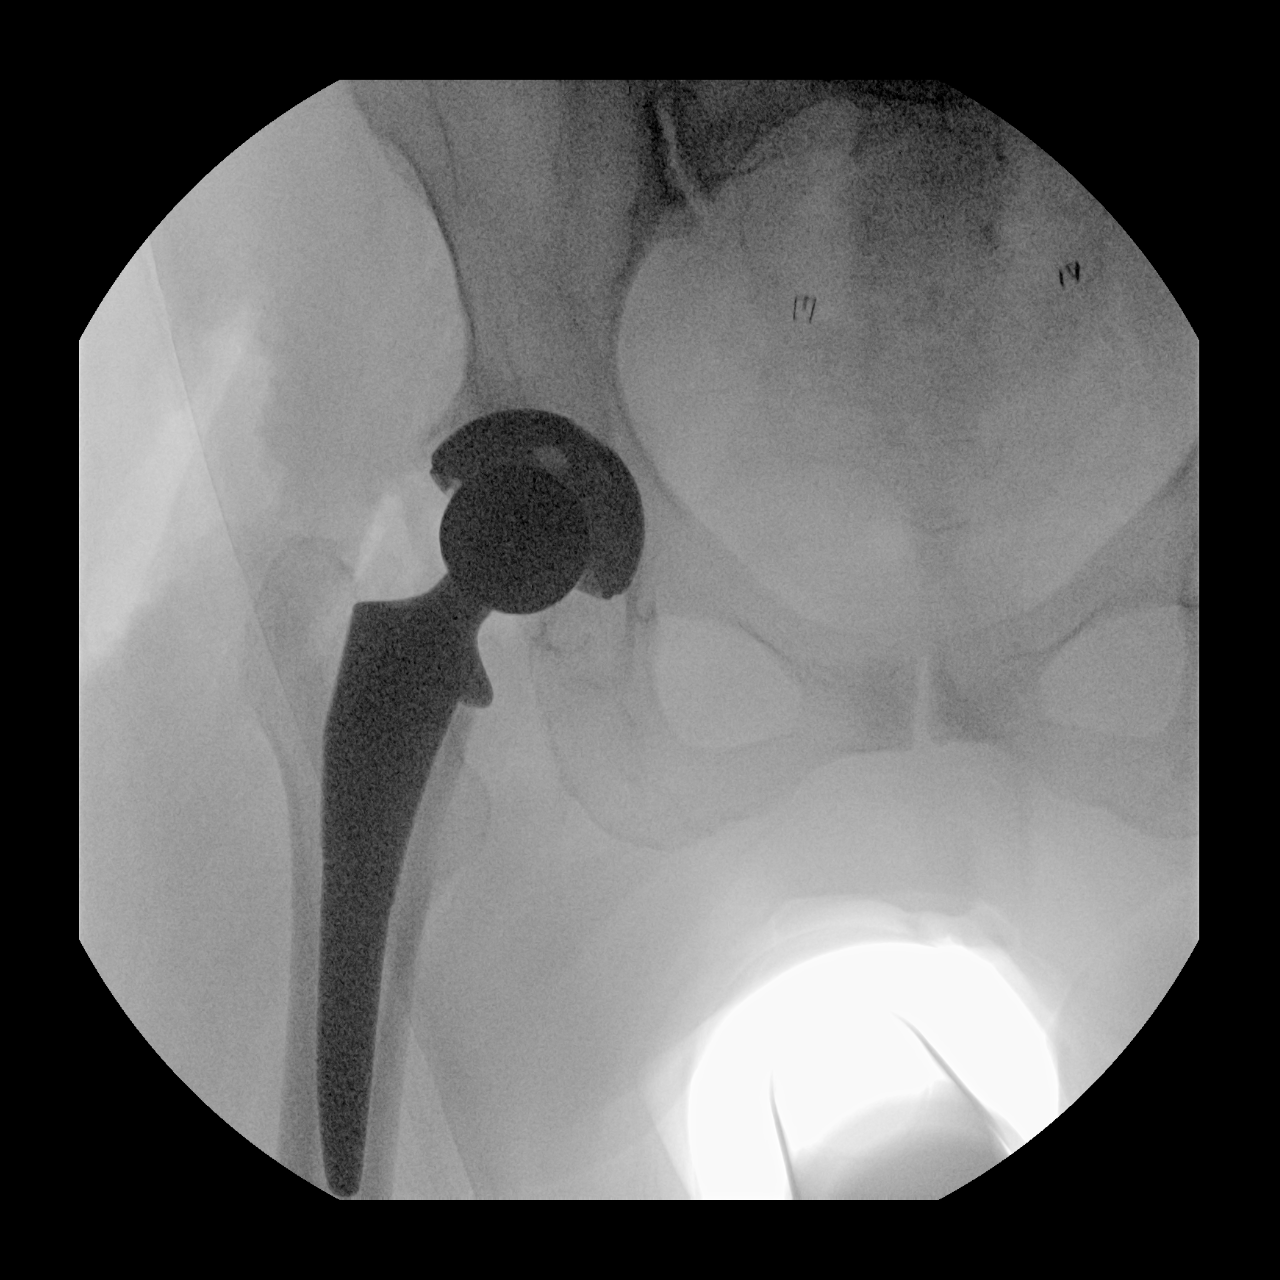

[1 of 1 positions shown; findings below may reference images not displayed]

FINDINGS: Interval right hip prosthesis in satisfactory position and
alignment. No fracture or dislocation seen on this view.
IMPRESSION: Satisfactory postoperative appearance of a right hip prosthesis.

## 2019-07-30 IMAGING — CR DG ABDOMEN 1V
1 series · 1 of 1 positions shown · non-contrast
Comparison: Postoperative pelvis radiograph 08/13/2018.

CLINICAL DATA: 67-year-old female with constipation after hip
surgery 6 days ago.

EXAM:
ABDOMEN - 1 VIEW

[t abdomen supine]
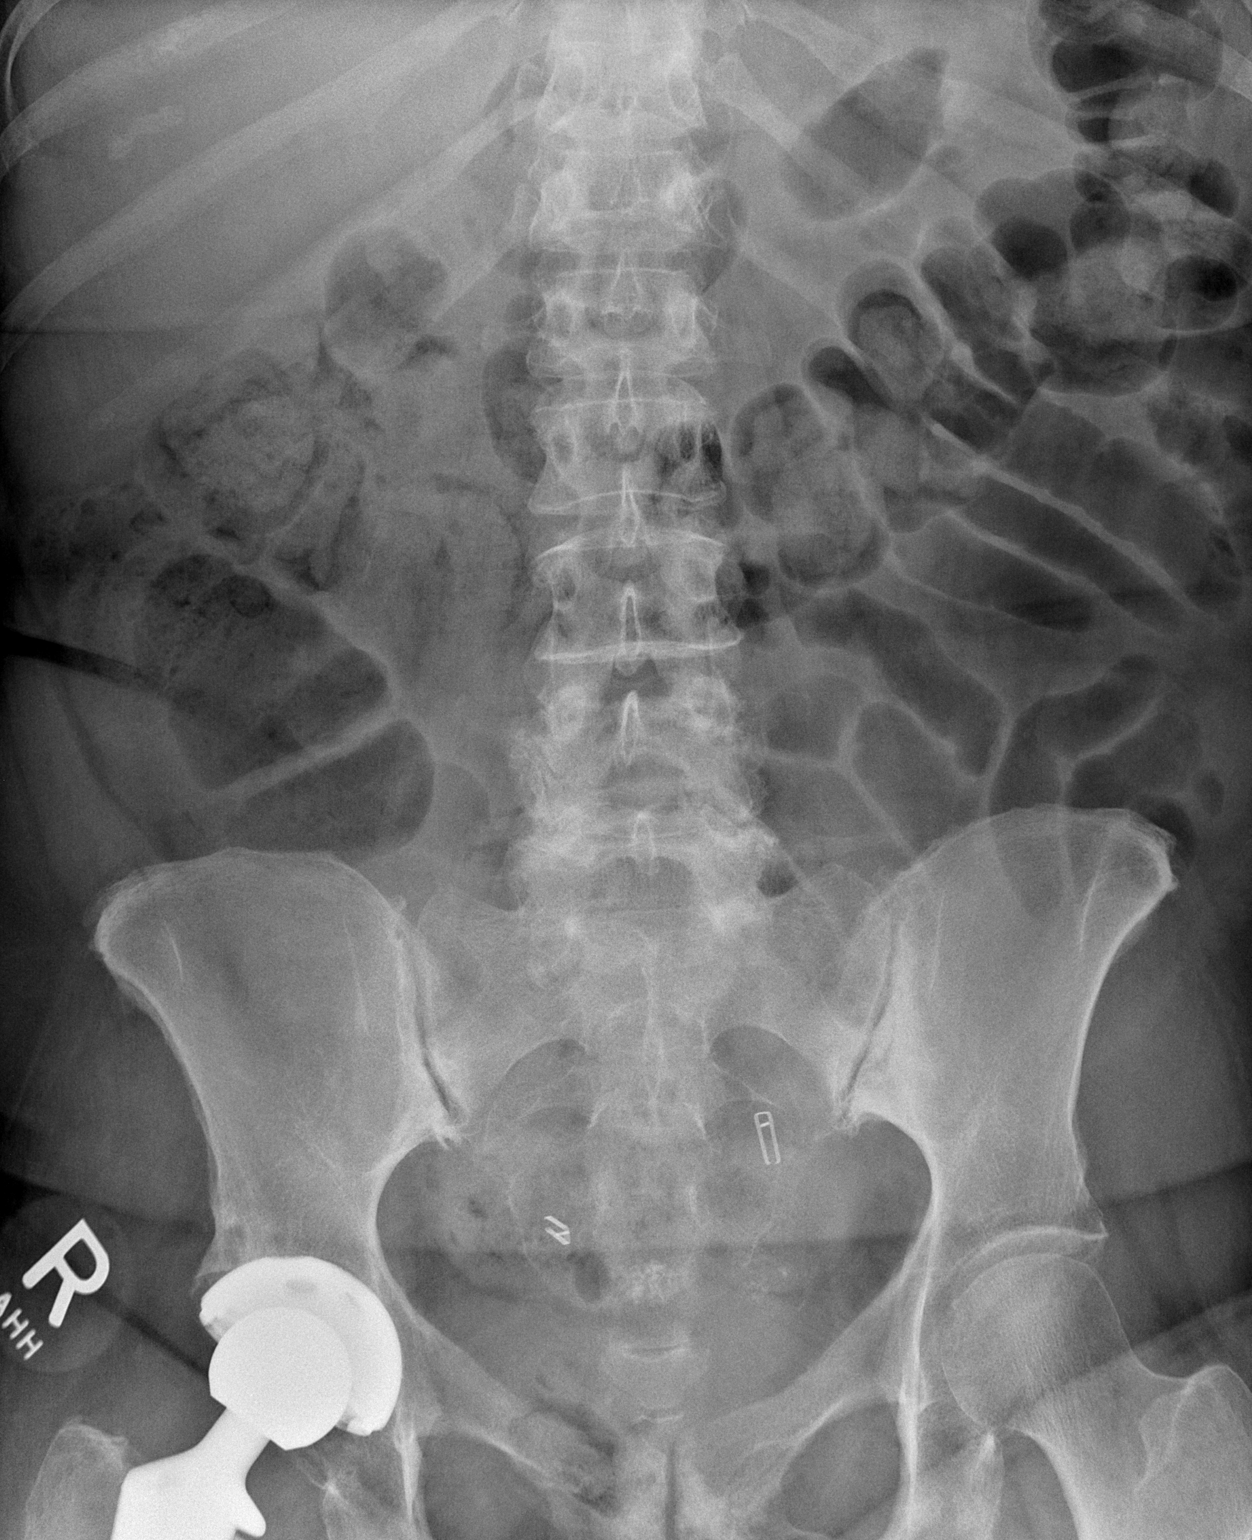

[1 of 1 positions shown; findings below may reference images not displayed]

FINDINGS: Supine AP view at 821 hours. Gas-filled nondilated small bowel in
the left abdomen and large bowel in the right and transverse colon.
Retained stool in the transverse colon. Little retained stool
distally. Visible abdominal and pelvic contours are within normal
limits. Tubal ligation clips. Degenerative sclerosis at the pubic
symphysis and bilateral SI joints. L4-L5 lumbar degeneration.
Partially visible bipolar right hip arthroplasty.
IMPRESSION: 1. Gas-filled but nondilated small and large bowel suggesting a
degree of ileus. There is retained stool throughout the transverse
colon.
2. Lower lumbar spine and pelvic degenerative osseous changes.
Partially visible right hip arthroplasty.

## 2019-12-05 ENCOUNTER — Ambulatory Visit: Payer: Medicare Other | Attending: Internal Medicine

## 2019-12-05 DIAGNOSIS — Z23 Encounter for immunization: Secondary | ICD-10-CM | POA: Insufficient documentation

## 2019-12-05 NOTE — Progress Notes (Signed)
   Covid-19 Vaccination Clinic  Name:  Orlinda Slomski    MRN: [id number] DOB: 15-Sep-1951  12/05/2019  Ms. Shepardson was observed post Covid-19 immunization for 15 minutes without incidence. She was provided with Vaccine Information Sheet and instruction to access the V-Safe system.   Ms. Shuping was instructed to call 911 with any severe reactions post vaccine: Marland Kitchen Difficulty breathing  . Swelling of your face and throat  . A fast heartbeat  . A bad rash all over your body  . Dizziness and weakness    Immunizations Administered    Name Date Dose VIS Date Route   Pfizer COVID-19 Vaccine 12/05/2019 12:23 PM 0.3 mL 10/25/2019 Intramuscular   Manufacturer: [employer], [employer]   Lot: [id number]   NDC: [address]-7

## 2019-12-26 ENCOUNTER — Ambulatory Visit: Payer: Medicare Other | Attending: Internal Medicine

## 2019-12-26 DIAGNOSIS — Z23 Encounter for immunization: Secondary | ICD-10-CM | POA: Insufficient documentation

## 2019-12-26 NOTE — Progress Notes (Signed)
   Covid-19 Vaccination Clinic  Name:  Hailey Holmes    MRN: [id number] DOB: May 22, 1951  12/26/2019  Hailey Holmes was observed post Covid-19 immunization for 15 minutes without incidence. She was provided with Vaccine Information Sheet and instruction to access the V-Safe system.   Hailey Holmes was instructed to call 911 with any severe reactions post vaccine: Marland Kitchen Difficulty breathing  . Swelling of your face and throat  . A fast heartbeat  . A bad rash all over your body  . Dizziness and weakness    Immunizations Administered    Name Date Dose VIS Date Route   Pfizer COVID-19 Vaccine 12/26/2019 12:37 PM 0.3 mL 10/25/2019 Intramuscular   Manufacturer: [employer], [employer]   Lot: [id number]   NDC: [address]-5

## 2022-10-28 ENCOUNTER — Other Ambulatory Visit: Payer: Self-pay

## 2022-10-28 ENCOUNTER — Encounter (HOSPITAL_BASED_OUTPATIENT_CLINIC_OR_DEPARTMENT_OTHER): Payer: Self-pay | Admitting: Emergency Medicine

## 2022-10-28 ENCOUNTER — Emergency Department (HOSPITAL_BASED_OUTPATIENT_CLINIC_OR_DEPARTMENT_OTHER): Payer: Medicare Other

## 2022-10-28 ENCOUNTER — Observation Stay (HOSPITAL_BASED_OUTPATIENT_CLINIC_OR_DEPARTMENT_OTHER)
Admission: EM | Admit: 2022-10-28 | Discharge: 2022-10-30 | Disposition: A | Discharge status: Home / Self Care | Payer: Medicare Other | Attending: General Surgery | Admitting: General Surgery

## 2022-10-28 DIAGNOSIS — J45909 Unspecified asthma, uncomplicated: Secondary | ICD-10-CM | POA: Diagnosis not present

## 2022-10-28 DIAGNOSIS — Z79899 Other long term (current) drug therapy: Secondary | ICD-10-CM | POA: Diagnosis not present

## 2022-10-28 DIAGNOSIS — K8000 Calculus of gallbladder with acute cholecystitis without obstruction: Principal | ICD-10-CM | POA: Insufficient documentation

## 2022-10-28 DIAGNOSIS — Z96641 Presence of right artificial hip joint: Secondary | ICD-10-CM | POA: Diagnosis not present

## 2022-10-28 DIAGNOSIS — R1011 Right upper quadrant pain: Secondary | ICD-10-CM | POA: Diagnosis present

## 2022-10-28 DIAGNOSIS — E039 Hypothyroidism, unspecified: Secondary | ICD-10-CM | POA: Diagnosis not present

## 2022-10-28 DIAGNOSIS — K810 Acute cholecystitis: Secondary | ICD-10-CM

## 2022-10-28 DIAGNOSIS — K819 Cholecystitis, unspecified: Secondary | ICD-10-CM | POA: Diagnosis present

## 2022-10-28 LAB — URINALYSIS, ROUTINE W REFLEX MICROSCOPIC
Bilirubin Urine: NEGATIVE
Glucose, UA: NEGATIVE mg/dL
Hgb urine dipstick: NEGATIVE
Ketones, ur: NEGATIVE mg/dL
Nitrite: NEGATIVE
Protein, ur: NEGATIVE mg/dL
Specific Gravity, Urine: 1.015 (ref 1.005–1.030)
pH: 7 (ref 5.0–8.0)

## 2022-10-28 LAB — COMPREHENSIVE METABOLIC PANEL
ALT: 24 U/L (ref 0–44)
AST: 34 U/L (ref 15–41)
Albumin: 3.9 g/dL (ref 3.5–5.0)
Alkaline Phosphatase: 55 U/L (ref 38–126)
Anion gap: 9 (ref 5–15)
BUN: 10 mg/dL (ref 8–23)
CO2: 23 mmol/L (ref 22–32)
Calcium: 9.1 mg/dL (ref 8.9–10.3)
Chloride: 103 mmol/L (ref 98–111)
Creatinine, Ser: 0.82 mg/dL (ref 0.44–1.00)
GFR, Estimated: >60 mL/min (ref >60)
Glucose, Bld: 185 mg/dL — ABNORMAL HIGH (ref 70–99)
Potassium: 3.6 mmol/L (ref 3.5–5.1)
Sodium: 135 mmol/L (ref 135–145)
Total Bilirubin: 0.5 mg/dL (ref 0.3–1.2)
Total Protein: 6.8 g/dL (ref 6.5–8.1)

## 2022-10-28 LAB — CBC WITH DIFFERENTIAL/PLATELET
Abs Immature Granulocytes: 0.02 10*3/uL (ref 0.00–0.07)
Basophils Absolute: 0 10*3/uL (ref 0.0–0.1)
Basophils Relative: 0 %
Eosinophils Absolute: 0 10*3/uL (ref 0.0–0.5)
Eosinophils Relative: 0 %
HCT: 42.8 % (ref 36.0–46.0)
Hemoglobin: 14.1 g/dL (ref 12.0–15.0)
Immature Granulocytes: 0 %
Lymphocytes Relative: 11 %
Lymphs Abs: 1 10*3/uL (ref 0.7–4.0)
MCH: 29 pg (ref 26.0–34.0)
MCHC: 32.9 g/dL (ref 30.0–36.0)
MCV: 88.1 fL (ref 80.0–100.0)
Monocytes Absolute: 0.5 10*3/uL (ref 0.1–1.0)
Monocytes Relative: 5 %
Neutro Abs: 7.9 10*3/uL — ABNORMAL HIGH (ref 1.7–7.7)
Neutrophils Relative %: 84 %
Platelets: 256 10*3/uL (ref 150–400)
RBC: 4.86 MIL/uL (ref 3.87–5.11)
RDW: 14.4 % (ref 11.5–15.5)
WBC: 9.5 10*3/uL (ref 4.0–10.5)
nRBC: 0 % (ref 0.0–0.2)

## 2022-10-28 LAB — URINALYSIS, MICROSCOPIC (REFLEX): RBC / HPF: NONE SEEN RBC/hpf (ref 0–5)

## 2022-10-28 LAB — CBG MONITORING, ED: Glucose-Capillary: 118 mg/dL — ABNORMAL HIGH (ref 70–99)

## 2022-10-28 LAB — GLUCOSE, CAPILLARY: Glucose-Capillary: 119 mg/dL — ABNORMAL HIGH (ref 70–99)

## 2022-10-28 LAB — LIPASE, BLOOD: Lipase: 25 U/L (ref 11–51)

## 2022-10-28 MED ORDER — SIMETHICONE 80 MG PO CHEW: 40.0000 mg | CHEWABLE_TABLET | Freq: Four times a day (QID) | ORAL | Status: DC | PRN

## 2022-10-28 MED ORDER — ACETAMINOPHEN 325 MG PO TABS: 650.0000 mg | ORAL_TABLET | Freq: Four times a day (QID) | ORAL | Status: DC | PRN

## 2022-10-28 MED ORDER — SODIUM CHLORIDE 0.9 % IV SOLN
2.0000 g | Freq: Once | INTRAVENOUS | Status: AC
  Administered 2022-10-28: 2 g via INTRAVENOUS
  Filled 2022-10-28: qty 20

## 2022-10-28 MED ORDER — MELATONIN 3 MG PO TABS: 3.0000 mg | ORAL_TABLET | Freq: Every evening | ORAL | Status: DC | PRN

## 2022-10-28 MED ORDER — SODIUM CHLORIDE 0.9 % IV SOLN
2.0000 g | INTRAVENOUS | Status: DC
  Administered 2022-10-29 – 2022-10-30 (×2): 2 g via INTRAVENOUS
  Filled 2022-10-28 (×2): qty 20

## 2022-10-28 MED ORDER — BISACODYL 5 MG PO TBEC: 5.0000 mg | DELAYED_RELEASE_TABLET | Freq: Every day | ORAL | Status: DC | PRN

## 2022-10-28 MED ORDER — ONDANSETRON HCL 4 MG/2ML IJ SOLN: 4.0000 mg | Freq: Four times a day (QID) | INTRAMUSCULAR | Status: DC | PRN

## 2022-10-28 MED ORDER — DIPHENHYDRAMINE HCL 12.5 MG/5ML PO ELIX: 12.5000 mg | ORAL_SOLUTION | Freq: Four times a day (QID) | ORAL | Status: DC | PRN

## 2022-10-28 MED ORDER — LEVOTHYROXINE SODIUM 100 MCG PO TABS: 100.0000 ug | ORAL_TABLET | Freq: Every day | ORAL | Status: DC

## 2022-10-28 MED ORDER — POLYETHYLENE GLYCOL 3350 17 G PO PACK: 17.0000 g | PACK | Freq: Every day | ORAL | Status: DC | PRN

## 2022-10-28 MED ORDER — METHOCARBAMOL 1000 MG/10ML IJ SOLN: 500.0000 mg | Freq: Four times a day (QID) | INTRAVENOUS | Status: DC | PRN

## 2022-10-28 MED ORDER — SODIUM CHLORIDE 0.9 % IV BOLUS
1000.0000 mL | Freq: Once | INTRAVENOUS | Status: AC
  Administered 2022-10-28: 1000 mL via INTRAVENOUS

## 2022-10-28 MED ORDER — LEVOTHYROXINE SODIUM 100 MCG PO TABS
100.0000 ug | ORAL_TABLET | Freq: Every day | ORAL | Status: DC
  Administered 2022-10-28 – 2022-10-30 (×2): 100 ug via ORAL
  Filled 2022-10-28 (×2): qty 1

## 2022-10-28 MED ORDER — METOPROLOL TARTRATE 5 MG/5ML IV SOLN: 5.0000 mg | Freq: Four times a day (QID) | INTRAVENOUS | Status: DC | PRN

## 2022-10-28 MED ORDER — ACETAMINOPHEN 650 MG RE SUPP: 650.0000 mg | Freq: Four times a day (QID) | RECTAL | Status: DC | PRN

## 2022-10-28 MED ORDER — DOCUSATE SODIUM 100 MG PO CAPS
100.0000 mg | ORAL_CAPSULE | Freq: Two times a day (BID) | ORAL | Status: DC
  Administered 2022-10-29 – 2022-10-30 (×2): 100 mg via ORAL
  Filled 2022-10-28 (×3): qty 1

## 2022-10-28 MED ORDER — TRAMADOL HCL 50 MG PO TABS
50.0000 mg | ORAL_TABLET | Freq: Four times a day (QID) | ORAL | Status: DC | PRN
  Administered 2022-10-28 – 2022-10-30 (×5): 100 mg via ORAL
  Filled 2022-10-28 (×5): qty 2

## 2022-10-28 MED ORDER — HYDROMORPHONE HCL 1 MG/ML IJ SOLN
0.5000 mg | INTRAMUSCULAR | Status: DC | PRN
  Administered 2022-10-28: 0.5 mg via INTRAVENOUS
  Filled 2022-10-28: qty 1

## 2022-10-28 MED ORDER — PANTOPRAZOLE SODIUM 40 MG PO TBEC
40.0000 mg | DELAYED_RELEASE_TABLET | Freq: Every day | ORAL | Status: DC
  Administered 2022-10-28 – 2022-10-30 (×2): 40 mg via ORAL
  Filled 2022-10-28 (×2): qty 1

## 2022-10-28 MED ORDER — SODIUM CHLORIDE 0.9 % IV SOLN: INTRAVENOUS | Status: DC

## 2022-10-28 MED ORDER — ACETAMINOPHEN 500 MG PO TABS: 1000.0000 mg | ORAL_TABLET | Freq: Four times a day (QID) | ORAL | Status: DC | PRN

## 2022-10-28 MED ORDER — FENTANYL CITRATE PF 50 MCG/ML IJ SOSY
50.0000 ug | PREFILLED_SYRINGE | Freq: Once | INTRAMUSCULAR | Status: AC
  Administered 2022-10-28: 50 ug via INTRAVENOUS
  Filled 2022-10-28: qty 1

## 2022-10-28 MED ORDER — ENOXAPARIN SODIUM 40 MG/0.4ML IJ SOSY
40.0000 mg | PREFILLED_SYRINGE | INTRAMUSCULAR | Status: DC
  Administered 2022-10-28: 40 mg via SUBCUTANEOUS
  Filled 2022-10-28: qty 0.4

## 2022-10-28 MED ORDER — ONDANSETRON 4 MG PO TBDP: 4.0000 mg | ORAL_TABLET | Freq: Four times a day (QID) | ORAL | Status: DC | PRN

## 2022-10-28 MED ORDER — DIPHENHYDRAMINE HCL 50 MG/ML IJ SOLN: 12.5000 mg | Freq: Four times a day (QID) | INTRAMUSCULAR | Status: DC | PRN

## 2022-10-28 MED ORDER — LORATADINE 10 MG PO TABS
10.0000 mg | ORAL_TABLET | Freq: Every day | ORAL | Status: DC
  Administered 2022-10-28 – 2022-10-30 (×2): 10 mg via ORAL
  Filled 2022-10-28 (×2): qty 1

## 2022-10-28 MED ORDER — ONDANSETRON HCL 4 MG/2ML IJ SOLN
4.0000 mg | Freq: Once | INTRAMUSCULAR | Status: AC
  Administered 2022-10-28: 4 mg via INTRAVENOUS
  Filled 2022-10-28: qty 2

## 2022-10-28 NOTE — ED Notes (Signed)
US at bedside

## 2022-10-28 NOTE — ED Notes (Signed)
Report to [facility] en route

## 2022-10-28 NOTE — H&P (Signed)
Admission Note  Hailey Holmes 19-May-1951  [id number].    Requesting MD: Dr. Ronnald Nian Chief Complaint/Reason for Consult: cholecystitis  HPI:  71 y.o. female with medical history significant for hypothyroidism, arthritis, asthma who presented to [place] ED with abdominal pain that began the night of 12/14. She had associated nausea and emesis. She denied fever or chills.  Work up in ED significant for ultrasound showing cholelithiasis, pericholecystic fluid, and gallbladder wall thickening, and positive sonographic murphy sign.   Currently pain is improved with pain medications - she does not tolerate codeine pain medications well having severe nausea/emesis. She has good response to tramadol. She reports sensitivity to hypoglycemia with symptoms of chills, mood changes, and syncope.  Substance use: occ etoh Allergies: augmentin, codeine, PCN, erythromycin Blood thinners: none Past Surgeries: tubal ligation    ROS: ROS reviewed and negative unless as above  History reviewed. No pertinent family history.  Past Medical History:  Diagnosis Date   Arthritis    Hypothyroidism    Seasonal allergies    Seasonal asthma     Past Surgical History:  Procedure Laterality Date   DILATION AND CURETTAGE OF UTERUS     indication  unterine polyp    MENISCECTOMY Right    TONSILLECTOMY     TOTAL HIP ARTHROPLASTY Right 08/13/2018   Procedure: RIGHT TOTAL HIP ARTHROPLASTY ANTERIOR APPROACH;  Surgeon: Gaynelle Arabian, MD;  Location: [facility];  Service: Orthopedics;  Laterality: Right;  163min   TUBAL LIGATION     WRIST SURGERY Right     Social History:  reports that she has never smoked. She has never used smokeless tobacco. She reports current alcohol use. She reports that she does not use drugs.  Allergies:  Allergies  Allergen Reactions   Augmentin [Amoxicillin-Pot Clavulanate] Diarrhea   Codeine Nausea Only    Dizziness   Penicillins Diarrhea    Has patient had a PCN  reaction causing immediate rash, facial/tongue/throat swelling, SOB or lightheadedness with hypotension: No Has patient had a PCN reaction causing severe rash involving mucus membranes or skin necrosis: No Has patient had a PCN reaction that required hospitalization: No Has patient had a PCN reaction occurring within the last 10 years: Yes If all of the above answers are "NO", then may proceed with Cephalosporin use.    Erythromycin Rash    (Not in a hospital admission)   Blood pressure 133/78, pulse 88, temperature 98.1 F (36.7 C), resp. rate 16, height 5\' 4"  (1.626 m), weight 104.3 kg, SpO2 98 %. Physical Exam: General: pleasant, WD, female who is laying in bed in NAD HEENT: head is normocephalic, atraumatic.  Sclera are noninjected.  Pupils equal and round. EOMs intact.  Ears and nose without any masses or lesions.  Mouth is pink and moist Heart: regular, rate, and rhythm.  Normal s1,s2. No obvious murmurs, gallops, or rubs noted.  Palpable radial and pedal pulses bilaterally Lungs: CTAB, no wheezes, rhonchi, or rales noted.  Respiratory effort nonlabored Abd: soft, ND, no masses, hernias, or organomegaly. Focal TTP in RUQ MSK: all 4 extremities are symmetrical with no cyanosis, clubbing, or edema. Skin: warm and dry with no masses, lesions, or rashes Neuro: Cranial nerves 2-12 grossly intact, sensation is normal throughout Psych: A&Ox3 with an appropriate affect.    Results for orders placed or performed during the hospital encounter of 10/28/22 (from the past 48 hour(s))  Urinalysis, Routine w reflex microscopic Urine, Clean Catch     Status: Abnormal  Collection Time: 10/28/22  9:50 AM  Result Value Ref Range   Color, Urine YELLOW YELLOW   APPearance CLEAR CLEAR   Specific Gravity, Urine 1.015 1.005 - 1.030   pH 7.0 5.0 - 8.0   Glucose, UA NEGATIVE NEGATIVE mg/dL   Hgb urine dipstick NEGATIVE NEGATIVE   Bilirubin Urine NEGATIVE NEGATIVE   Ketones, ur NEGATIVE NEGATIVE  mg/dL   Protein, ur NEGATIVE NEGATIVE mg/dL   Nitrite NEGATIVE NEGATIVE   Leukocytes,Ua TRACE (A) NEGATIVE    Comment: Performed at [facility], [address] [place], [place] [address]  Urinalysis, Microscopic (reflex)     Status: Abnormal   Collection Time: 10/28/22  9:50 AM  Result Value Ref Range   RBC / HPF NONE SEEN 0 - 5 RBC/hpf   WBC, UA 0-5 0 - 5 WBC/hpf   Bacteria, UA MANY (A) NONE SEEN   Squamous Epithelial / LPF 6-10 0 - 5   Hyaline Casts, UA PRESENT     Comment: Performed at [facility], [address] [place], [place] [address]  CBC with Differential     Status: Abnormal   Collection Time: 10/28/22 10:04 AM  Result Value Ref Range   WBC 9.5 4.0 - 10.5 K/uL   RBC 4.86 3.87 - 5.11 MIL/uL   Hemoglobin 14.1 12.0 - 15.0 g/dL   HCT 34.7 42.5 - 95.6 %   MCV 88.1 80.0 - 100.0 fL   MCH 29.0 26.0 - 34.0 pg   MCHC 32.9 30.0 - 36.0 g/dL   RDW 38.7 56.4 - 33.2 %   Platelets 256 150 - 400 K/uL   nRBC 0.0 0.0 - 0.2 %   Neutrophils Relative % 84 %   Neutro Abs 7.9 (H) 1.7 - 7.7 K/uL   Lymphocytes Relative 11 %   Lymphs Abs 1.0 0.7 - 4.0 K/uL   Monocytes Relative 5 %   Monocytes Absolute 0.5 0.1 - 1.0 K/uL   Eosinophils Relative 0 %   Eosinophils Absolute 0.0 0.0 - 0.5 K/uL   Basophils Relative 0 %   Basophils Absolute 0.0 0.0 - 0.1 K/uL   Immature Granulocytes 0 %   Abs Immature Granulocytes 0.02 0.00 - 0.07 K/uL    Comment: Performed at [facility], [address] [place], [place] [address]  Comprehensive metabolic panel     Status: Abnormal   Collection Time: 10/28/22 10:04 AM  Result Value Ref Range   Sodium 135 135 - 145 mmol/L   Potassium 3.6 3.5 - 5.1 mmol/L   Chloride 103 98 - 111 mmol/L   CO2 23 22 - 32 mmol/L   Glucose, Bld 185 (H) 70 - 99 mg/dL    Comment: Glucose reference range applies only to samples taken after fasting for at least 8 hours.   BUN 10 8 - 23 mg/dL   Creatinine, Ser 4.16 0.44 - 1.00 mg/dL   Calcium  9.1 8.9 - 60.6 mg/dL   Total Protein 6.8 6.5 - 8.1 g/dL   Albumin 3.9 3.5 - 5.0 g/dL   AST 34 15 - 41 U/L   ALT 24 0 - 44 U/L   Alkaline Phosphatase 55 38 - 126 U/L   Total Bilirubin 0.5 0.3 - 1.2 mg/dL   GFR, Estimated >30 >16 mL/min    Comment: (NOTE) Calculated using the CKD-EPI Creatinine Equation (2021)    Anion gap 9 5 - 15    Comment: Performed at [facility], [address] [place], [place] [address]  Lipase, blood     Status: None   Collection Time: 10/28/22 10:04 AM  Result Value Ref Range   Lipase 25 11 - 51 U/L    Comment: Performed at [facility], [address] [place], [place] [address]   US Abdomen Limited RUQ (LIVER/GB)  Result Date: 10/28/2022 CLINICAL DATA:  Right upper quadrant abdominal pain, nausea and vomiting for 1 day. EXAM: ULTRASOUND ABDOMEN LIMITED RIGHT UPPER QUADRANT COMPARISON:  Abdominopelvic CT 08/18/2018. FINDINGS: Gallbladder: Previously demonstrated large peripherally calcified gallstone is in the gallbladder neck. There is new gallbladder wall thickening with pericholecystic fluid and a positive sonographic Murphy sign. Common bile duct: Diameter: Upper limits of normal for age, 7 mm. No intrahepatic biliary dilatation or choledocholithiasis identified. Liver: Multiple hepatic cysts are seen which measure up to 3.8 x 3.0 x 4.1 cm in the left lobe. No suspicious liver lesions. Mildly increased hepatic echogenicity suggesting steatosis. Portal vein is patent on color Doppler imaging with normal direction of blood flow towards the liver. Other: No ascites. IMPRESSION: 1. Cholelithiasis with gallbladder wall thickening, pericholecystic fluid and positive sonographic Murphy sign. Findings are suspicious for acute cholecystitis. 2. Common bile duct at the upper limits of normal for age. 3. Benign-appearing hepatic cysts as seen on previous CT. 4. Probable hepatic steatosis. Electronically Signed   By: Richardean Sale M.D.   On: 10/28/2022  11:07      Assessment/Plan Acute cholecystitis Patient seen and examined and relevant labs and imaging reviewed. Work up is consistent with acute cholecystitis. Recommend laparoscopic cholecystectomy for definitive treatment. Due to timing of transfer and last PO intake plan on operation 12/16. Admit to observation for IV antibiotics, IV fluids.   FEN: clears, npo mn ID: [email or web address] VTE: lovenox  Hypothyroidism - home meds GERD - PPI Allergies - claritin  I reviewed ED provider notes, last 24 h vitals and pain scores, last 48 h intake and output, last 24 h labs and trends, and last 24 h imaging results.    Winferd Humphrey, [facility] 10/28/2022, 4:15 PM Please see Amion for pager number during day hours 7:00am-4:30pm

## 2022-10-28 NOTE — ED Provider Notes (Signed)
[facility] EMERGENCY DEPARTMENT Provider Note   CSN: [id number] Arrival date & time: 10/28/22  6720     History  Chief Complaint  Patient presents with   Abdominal Pain    Hailey Holmes is a 71 y.o. female.  Patient here with right upper quadrant abdominal pain since last night.  Some nausea and episode of emesis.  States she thinks she has had gallstones in the past.  No major abdominal surgeries otherwise.  Denies any fever, chills.  No issues with constipation or diarrhea.  Denies any alcohol use or suspicious food intake.  No chest pain or shortness of breath.  She was able to drink a little bit of coffee this morning and a piece of bread with some discomfort.  The history is provided by the patient.       Home Medications Prior to Admission medications   Medication Sig Start Date End Date Taking? Authorizing Provider  Bilberry 1000 MG CAPS Take 1,000 mg by mouth daily.    [provider]  bisacodyl (DULCOLAX) 5 MG EC tablet Take 15 mg by mouth daily as needed for moderate constipation.    [provider]  Calcium Polycarbophil (FIBER-CAPS PO) Take 1 capsule by mouth every evening.    [provider]  CALCIUM-MAGNESIUM PO Take 1-2 tablets by mouth See admin instructions. Take 1 tablet in the morning and 2 tablets in the evening    [provider]  Camphor-Menthol-Methyl Sal (TIGER BALM MUSCLE RUB EX) Apply 1 application topically as needed (muscle pain).    [provider]  Cholecalciferol (VITAMIN D3) 5000 units CAPS Take 5,000 Units by mouth See admin instructions. Take 5000 units once daily except sundays    [provider]  fexofenadine (ALLEGRA) 180 MG tablet Take 180 mg by mouth daily.    [provider]  Ginkgo Biloba 120 MG CAPS Take 120 mg by mouth daily.    [provider]  GINSENG PO Take 550 mg by mouth See admin instructions. Take 550 mg by mouth once daily for 2 weeks out of every  month    [provider]  Histamine Dihydrochloride (AUSTRALIAN DREAM ARTHRITIS EX) Apply 1 application topically as needed (arthritis).    [provider]  levothyroxine (SYNTHROID, LEVOTHROID) 100 MCG tablet Take 100 mcg by mouth daily before breakfast.    [provider]  Manganese 10 MG TABS Take 10 mg by mouth every evening.    [provider]  Menthol, Topical Analgesic, (BIOFREEZE EX) Apply 1 application topically as needed (muscle pain).    [provider]  methocarbamol (ROBAXIN) 500 MG tablet Take 1 tablet (500 mg total) by mouth every 6 (six) hours as needed for muscle spasms. 08/16/18   Edmisten, Kristie L, PA  metroNIDAZOLE (FLAGYL) 500 MG tablet Take 1 tablet (500 mg total) by mouth every 8 (eight) hours. 08/20/18   Meredeth Ide, MD  Misc Natural Products (TART CHERRY ADVANCED PO) Take 1,200 mg by mouth 2 (two) times daily.    [provider]  Multiple Vitamin (MULTIVITAMIN WITH MINERALS) TABS tablet Take 1 tablet by mouth daily.    [provider]  Omega-3 Fatty Acids (FISH OIL PO) Take 1,280 mg by mouth 2 (two) times daily.    [provider]  OVER THE COUNTER MEDICATION Take 1 tablet by mouth daily with lunch. DGL Liquorice otc supplement    [provider]  OVER THE COUNTER MEDICATION Take 1 tablet by mouth daily.  Leg Veins otc supplement    [provider]  OVER THE COUNTER MEDICATION Take 1 tablet by mouth 2 (two) times daily. Joint Soother otc supplement    [provider]  OVER THE COUNTER MEDICATION Take 1 tablet by mouth 2 (two) times daily. Flexiaid otc supplement    [provider]  OVER THE COUNTER MEDICATION Take 1 tablet by mouth every evening. Bladder Support otc supplement    [provider]  OVER THE COUNTER MEDICATION Take 1 tablet by mouth every evening. Tri-chromium otc supplement    [provider]  OVER THE COUNTER MEDICATION Place 5 tablets  under the tongue daily. aesculus hippocastanum pellets    [provider]  SOY ISOFLAVONES PO Take 60 mg by mouth every evening.    [provider]  Specialty Vitamins Products (COLLAGEN ULTRA) CAPS Take 1 capsule by mouth 2 (two) times daily.    [provider]  traMADol (ULTRAM) 50 MG tablet Take 50 mg by mouth 4 (four) times daily as needed for moderate pain or severe pain.    [provider]  VALERIAN PO Take 110 mg by mouth at bedtime.    [provider]  Vitamin D, Ergocalciferol, (DRISDOL) 50000 units CAPS capsule Take 50,000 Units by mouth every Sunday.    [provider]      Allergies    Augmentin [amoxicillin-pot clavulanate], Codeine, Penicillins, and Erythromycin    Review of Systems   Review of Systems  Physical Exam Updated Vital Signs BP 133/78   Pulse 88   Temp 98.1 F (36.7 C)   Resp 16   Ht 5\' 4"  (1.626 m)   Wt 104.3 kg   SpO2 98%   BMI 39.48 kg/m  Physical Exam Vitals and nursing note reviewed.  Constitutional:      General: She is not in acute distress.    Appearance: She is well-developed.  HENT:     Head: Normocephalic and atraumatic.     Nose: Nose normal.     Mouth/Throat:     Mouth: Mucous membranes are moist.  Eyes:     Extraocular Movements: Extraocular movements intact.     Conjunctiva/sclera: Conjunctivae normal.     Pupils: Pupils are equal, round, and reactive to light.  Cardiovascular:     Rate and Rhythm: Normal rate and regular rhythm.     Pulses: Normal pulses.     Heart sounds: Normal heart sounds. No murmur heard. Pulmonary:     Effort: Pulmonary effort is normal. No respiratory distress.     Breath sounds: Normal breath sounds.  Abdominal:     Palpations: Abdomen is soft.     Tenderness: There is abdominal tenderness in the right upper quadrant.  Musculoskeletal:        General: No swelling.     Cervical back: Neck supple.  Skin:    General: Skin is warm and dry.      Capillary Refill: Capillary refill takes less than 2 seconds.  Neurological:     Mental Status: She is alert.  Psychiatric:        Mood and Affect: Mood normal.     ED Results / Procedures / Treatments   Labs (all labs ordered are listed, but only abnormal results are displayed) Labs Reviewed  CBC WITH DIFFERENTIAL/PLATELET - Abnormal; Notable for the following components:      Result Value   Neutro Abs 7.9 (*)    All other components within normal limits  COMPREHENSIVE  METABOLIC PANEL - Abnormal; Notable for the following components:   Glucose, Bld 185 (*)    All other components within normal limits  URINALYSIS, ROUTINE W REFLEX MICROSCOPIC - Abnormal; Notable for the following components:   Leukocytes,Ua TRACE (*)    All other components within normal limits  URINALYSIS, MICROSCOPIC (REFLEX) - Abnormal; Notable for the following components:   Bacteria, UA MANY (*)    All other components within normal limits  URINE CULTURE  LIPASE, BLOOD    EKG None  Radiology US Abdomen Limited RUQ (LIVER/GB)  Result Date: 10/28/2022 CLINICAL DATA:  Right upper quadrant abdominal pain, nausea and vomiting for 1 day. EXAM: ULTRASOUND ABDOMEN LIMITED RIGHT UPPER QUADRANT COMPARISON:  Abdominopelvic CT 08/18/2018. FINDINGS: Gallbladder: Previously demonstrated large peripherally calcified gallstone is in the gallbladder neck. There is new gallbladder wall thickening with pericholecystic fluid and a positive sonographic Murphy sign. Common bile duct: Diameter: Upper limits of normal for age, 7 mm. No intrahepatic biliary dilatation or choledocholithiasis identified. Liver: Multiple hepatic cysts are seen which measure up to 3.8 x 3.0 x 4.1 cm in the left lobe. No suspicious liver lesions. Mildly increased hepatic echogenicity suggesting steatosis. Portal vein is patent on color Doppler imaging with normal direction of blood flow towards the liver. Other: No ascites. IMPRESSION: 1. Cholelithiasis  with gallbladder wall thickening, pericholecystic fluid and positive sonographic Murphy sign. Findings are suspicious for acute cholecystitis. 2. Common bile duct at the upper limits of normal for age. 3. Benign-appearing hepatic cysts as seen on previous CT. 4. Probable hepatic steatosis. Electronically Signed   By: Carey BullocksWilliam  Veazey M.D.   On: 10/28/2022 11:07    Procedures Procedures    Medications Ordered in ED Medications  cefTRIAXone (ROCEPHIN) 2 g in sodium chloride 0.9 % 100 mL IVPB (2 g Intravenous New Bag/Given 10/28/22 1128)  fentaNYL (SUBLIMAZE) injection 50 mcg (50 mcg Intravenous Given 10/28/22 1014)  ondansetron (ZOFRAN) injection 4 mg (4 mg Intravenous Given 10/28/22 1014)  sodium chloride 0.9 % bolus 1,000 mL (1,000 mLs Intravenous New Bag/Given 10/28/22 1013)    ED Course/ Medical Decision Making/ A&P                           Medical Decision Making Amount and/or Complexity of Data Reviewed Labs: ordered. Radiology: ordered.  Risk Prescription drug management. Decision regarding hospitalization.   Modesto CharonMartha Holmes is here with abdominal pain.  She is tender in the right upper quadrant.  She has normal vitals.  No fever.  History of seasonal asthma.  She is very well-appearing.  She is mostly tender right upper quadrant.  Differential diagnosis includes biliary colic versus cholecystitis versus pancreatitis versus less likely UTI.  At this time I have very low suspicion for colitis or bowel obstruction or other acute intra-abdominal process.  Will give IV fentanyl, IV Zofran and IV fluid bolus.  Will get CBC, CMP, lipase, right upper quadrant ultrasound.  Per my review and interpretation of labs is no significant anemia, electrolyte abnormality, kidney injury or leukocytosis.  Gallbladder ultrasound consistent with acute cholecystitis.  Will give her dose IV Rocephin.  I have talked with Carl BestMartha Kabrich PA with general surgery.  Will discuss with attending about how to  proceed with surgical process either ED to ED transfer or ED to preop.  Plan will be for admission to the general surgery team with Dr. Dwain SarnaWakefield.  This chart was dictated using voice recognition software.  Despite best efforts  to proofread,  errors can occur which can change the documentation meaning.         Final Clinical Impression(s) / ED Diagnoses Final diagnoses:  Acute cholecystitis    Rx / DC Orders ED Discharge Orders     None         Virgina Norfolk, DO 10/28/22 1147

## 2022-10-28 NOTE — ED Triage Notes (Signed)
Pt to ER with c/o RUQ abdominal pain starting yesterday.  Pt states n/v yesterday.  Pt sates pain radiates to right around to back in a band.  Pt states hx of a gallstone approximately 4 years ago.

## 2022-10-29 ENCOUNTER — Encounter (HOSPITAL_COMMUNITY): Payer: Self-pay

## 2022-10-29 ENCOUNTER — Observation Stay (HOSPITAL_COMMUNITY): Payer: Medicare Other | Admitting: Certified Registered Nurse Anesthetist

## 2022-10-29 ENCOUNTER — Observation Stay (HOSPITAL_BASED_OUTPATIENT_CLINIC_OR_DEPARTMENT_OTHER): Payer: Medicare Other | Admitting: Certified Registered Nurse Anesthetist

## 2022-10-29 ENCOUNTER — Other Ambulatory Visit: Payer: Self-pay

## 2022-10-29 ENCOUNTER — Encounter (HOSPITAL_COMMUNITY)
Admission: EM | Disposition: A | Discharge status: Home / Self Care | Payer: Self-pay | Source: Home / Self Care | Attending: Emergency Medicine

## 2022-10-29 DIAGNOSIS — K8000 Calculus of gallbladder with acute cholecystitis without obstruction: Secondary | ICD-10-CM

## 2022-10-29 HISTORY — PX: CHOLECYSTECTOMY: SHX55

## 2022-10-29 LAB — CBC
HCT: 39.8 % (ref 36.0–46.0)
Hemoglobin: 12.5 g/dL (ref 12.0–15.0)
MCH: 28.9 pg (ref 26.0–34.0)
MCHC: 31.4 g/dL (ref 30.0–36.0)
MCV: 92.1 fL (ref 80.0–100.0)
Platelets: 203 10*3/uL (ref 150–400)
RBC: 4.32 MIL/uL (ref 3.87–5.11)
RDW: 15 % (ref 11.5–15.5)
WBC: 6.6 10*3/uL (ref 4.0–10.5)
nRBC: 0 % (ref 0.0–0.2)

## 2022-10-29 LAB — COMPREHENSIVE METABOLIC PANEL
ALT: 21 U/L (ref 0–44)
AST: 19 U/L (ref 15–41)
Albumin: 3.3 g/dL — ABNORMAL LOW (ref 3.5–5.0)
Alkaline Phosphatase: 45 U/L (ref 38–126)
Anion gap: 6 (ref 5–15)
BUN: 8 mg/dL (ref 8–23)
CO2: 24 mmol/L (ref 22–32)
Calcium: 8.4 mg/dL — ABNORMAL LOW (ref 8.9–10.3)
Chloride: 109 mmol/L (ref 98–111)
Creatinine, Ser: 0.58 mg/dL (ref 0.44–1.00)
GFR, Estimated: >60 mL/min (ref >60)
Glucose, Bld: 103 mg/dL — ABNORMAL HIGH (ref 70–99)
Potassium: 3.7 mmol/L (ref 3.5–5.1)
Sodium: 139 mmol/L (ref 135–145)
Total Bilirubin: 0.4 mg/dL (ref 0.3–1.2)
Total Protein: 5.9 g/dL — ABNORMAL LOW (ref 6.5–8.1)

## 2022-10-29 LAB — GLUCOSE, CAPILLARY
Glucose-Capillary: 100 mg/dL — ABNORMAL HIGH (ref 70–99)
Glucose-Capillary: 116 mg/dL — ABNORMAL HIGH (ref 70–99)
Glucose-Capillary: 137 mg/dL — ABNORMAL HIGH (ref 70–99)
Glucose-Capillary: 170 mg/dL — ABNORMAL HIGH (ref 70–99)
Glucose-Capillary: 92 mg/dL (ref 70–99)
Glucose-Capillary: 93 mg/dL (ref 70–99)

## 2022-10-29 LAB — URINE CULTURE: Culture: NO GROWTH

## 2022-10-29 LAB — SURGICAL PCR SCREEN
MRSA, PCR: NEGATIVE
Staphylococcus aureus: NEGATIVE

## 2022-10-29 SURGERY — LAPAROSCOPIC CHOLECYSTECTOMY
Anesthesia: General | Site: Abdomen

## 2022-10-29 MED ORDER — PHENYLEPHRINE 80 MCG/ML (10ML) SYRINGE FOR IV PUSH (FOR BLOOD PRESSURE SUPPORT)
PREFILLED_SYRINGE | INTRAVENOUS | Status: DC | PRN
  Administered 2022-10-29: 160 ug via INTRAVENOUS
  Administered 2022-10-29: 80 ug via INTRAVENOUS
  Administered 2022-10-29: 160 ug via INTRAVENOUS
  Administered 2022-10-29: 80 ug via INTRAVENOUS

## 2022-10-29 MED ORDER — LIDOCAINE HCL (CARDIAC) PF 100 MG/5ML IV SOSY
PREFILLED_SYRINGE | INTRAVENOUS | Status: DC | PRN
  Administered 2022-10-29: 80 mg via INTRAVENOUS

## 2022-10-29 MED ORDER — ROCURONIUM BROMIDE 10 MG/ML (PF) SYRINGE
PREFILLED_SYRINGE | INTRAVENOUS | Status: AC
  Filled 2022-10-29: qty 10

## 2022-10-29 MED ORDER — LACTATED RINGERS IR SOLN
Status: DC | PRN
  Administered 2022-10-29: 1000 mL

## 2022-10-29 MED ORDER — 0.9 % SODIUM CHLORIDE (POUR BTL) OPTIME
TOPICAL | Status: DC | PRN
  Administered 2022-10-29: 1000 mL

## 2022-10-29 MED ORDER — SUGAMMADEX SODIUM 200 MG/2ML IV SOLN
INTRAVENOUS | Status: DC | PRN
  Administered 2022-10-29: 200 mg via INTRAVENOUS

## 2022-10-29 MED ORDER — ONDANSETRON HCL 4 MG/2ML IJ SOLN
INTRAMUSCULAR | Status: DC | PRN
  Administered 2022-10-29: 4 mg via INTRAVENOUS

## 2022-10-29 MED ORDER — FENTANYL CITRATE (PF) 100 MCG/2ML IJ SOLN
INTRAMUSCULAR | Status: DC | PRN
  Administered 2022-10-29: 50 ug via INTRAVENOUS
  Administered 2022-10-29 (×2): 25 ug via INTRAVENOUS

## 2022-10-29 MED ORDER — ROCURONIUM BROMIDE 10 MG/ML (PF) SYRINGE
PREFILLED_SYRINGE | INTRAVENOUS | Status: DC | PRN
  Administered 2022-10-29: 50 mg via INTRAVENOUS

## 2022-10-29 MED ORDER — LIDOCAINE HCL (PF) 2 % IJ SOLN
INTRAMUSCULAR | Status: AC
  Filled 2022-10-29: qty 5

## 2022-10-29 MED ORDER — PROPOFOL 10 MG/ML IV BOLUS
INTRAVENOUS | Status: DC | PRN
  Administered 2022-10-29: 120 mg via INTRAVENOUS
  Administered 2022-10-29: 20 mg via INTRAVENOUS

## 2022-10-29 MED ORDER — BUPIVACAINE-EPINEPHRINE (PF) 0.5% -1:200000 IJ SOLN
INTRAMUSCULAR | Status: DC | PRN
  Administered 2022-10-29: 25 mL

## 2022-10-29 MED ORDER — PHENYLEPHRINE 80 MCG/ML (10ML) SYRINGE FOR IV PUSH (FOR BLOOD PRESSURE SUPPORT)
PREFILLED_SYRINGE | INTRAVENOUS | Status: AC
  Filled 2022-10-29: qty 10

## 2022-10-29 MED ORDER — ACETAMINOPHEN 10 MG/ML IV SOLN
INTRAVENOUS | Status: DC | PRN
  Administered 2022-10-29: 1000 mg via INTRAVENOUS

## 2022-10-29 MED ORDER — PHENYLEPHRINE HCL (PRESSORS) 10 MG/ML IV SOLN
INTRAVENOUS | Status: AC
  Filled 2022-10-29: qty 1

## 2022-10-29 MED ORDER — DEXAMETHASONE SODIUM PHOSPHATE 10 MG/ML IJ SOLN
INTRAMUSCULAR | Status: DC | PRN
  Administered 2022-10-29: 10 mg via INTRAVENOUS

## 2022-10-29 MED ORDER — FENTANYL CITRATE PF 50 MCG/ML IJ SOSY
12.5000 ug | PREFILLED_SYRINGE | INTRAMUSCULAR | Status: DC | PRN
  Administered 2022-10-29: 25 ug via INTRAVENOUS
  Filled 2022-10-29: qty 1

## 2022-10-29 MED ORDER — PROPOFOL 10 MG/ML IV BOLUS
INTRAVENOUS | Status: AC
  Filled 2022-10-29: qty 20

## 2022-10-29 MED ORDER — SUCCINYLCHOLINE CHLORIDE 200 MG/10ML IV SOSY
PREFILLED_SYRINGE | INTRAVENOUS | Status: AC
  Filled 2022-10-29: qty 10

## 2022-10-29 MED ORDER — LACTATED RINGERS IV SOLN: INTRAVENOUS | Status: DC | PRN

## 2022-10-29 MED ORDER — DEXTROSE 50 % IV SOLN
INTRAVENOUS | Status: AC
  Administered 2022-10-29: 25 mL
  Filled 2022-10-29: qty 50

## 2022-10-29 MED ORDER — ONDANSETRON HCL 4 MG/2ML IJ SOLN
INTRAMUSCULAR | Status: AC
  Filled 2022-10-29: qty 2

## 2022-10-29 MED ORDER — DEXAMETHASONE SODIUM PHOSPHATE 10 MG/ML IJ SOLN
INTRAMUSCULAR | Status: AC
  Filled 2022-10-29: qty 1

## 2022-10-29 MED ORDER — FENTANYL CITRATE (PF) 100 MCG/2ML IJ SOLN
INTRAMUSCULAR | Status: AC
  Filled 2022-10-29: qty 2

## 2022-10-29 MED ORDER — FENTANYL CITRATE PF 50 MCG/ML IJ SOSY: 25.0000 ug | PREFILLED_SYRINGE | INTRAMUSCULAR | Status: DC | PRN

## 2022-10-29 MED ORDER — BUPIVACAINE-EPINEPHRINE (PF) 0.5% -1:200000 IJ SOLN
INTRAMUSCULAR | Status: AC
  Filled 2022-10-29: qty 30

## 2022-10-29 MED ORDER — PHENYLEPHRINE HCL-NACL 20-0.9 MG/250ML-% IV SOLN
INTRAVENOUS | Status: DC | PRN
  Administered 2022-10-29: 25 ug/min via INTRAVENOUS

## 2022-10-29 MED ORDER — ACETAMINOPHEN 10 MG/ML IV SOLN
INTRAVENOUS | Status: AC
  Filled 2022-10-29: qty 100

## 2022-10-29 SURGICAL SUPPLY — 37 items
APPLIER CLIP 5 13 M/L LIGAMAX5 (MISCELLANEOUS) ×1
BAG COUNTER SPONGE SURGICOUNT (BAG) IMPLANT
CATH REDDICK CHOLANGI 4FR 50CM (CATHETERS) ×1 IMPLANT
CHLORAPREP W/TINT 26 (MISCELLANEOUS) ×1 IMPLANT
CLIP APPLIE 5 13 M/L LIGAMAX5 (MISCELLANEOUS) ×1 IMPLANT
COVER MAYO STAND STRL (DRAPES) ×1 IMPLANT
COVER MAYO STAND XLG (MISCELLANEOUS) ×1 IMPLANT
COVER SURGICAL LIGHT HANDLE (MISCELLANEOUS) ×1 IMPLANT
DERMABOND ADVANCED .7 DNX12 (GAUZE/BANDAGES/DRESSINGS) ×1 IMPLANT
DRAPE C-ARM 42X120 X-RAY (DRAPES) ×1 IMPLANT
ELECT REM PT RETURN 15FT ADLT (MISCELLANEOUS) ×1 IMPLANT
GLOVE BIO SURGEON STRL SZ7.5 (GLOVE) ×2 IMPLANT
GOWN STRL REUS W/ TWL LRG LVL3 (GOWN DISPOSABLE) ×1 IMPLANT
GOWN STRL REUS W/ TWL XL LVL3 (GOWN DISPOSABLE) ×1 IMPLANT
GOWN STRL REUS W/TWL LRG LVL3 (GOWN DISPOSABLE) ×1
GOWN STRL REUS W/TWL XL LVL3 (GOWN DISPOSABLE) ×1
HEMOSTAT SURGICEL 4X8 (HEMOSTASIS) ×1 IMPLANT
IRRIG SUCT STRYKERFLOW 2 WTIP (MISCELLANEOUS) ×1
IRRIGATION SUCT STRKRFLW 2 WTP (MISCELLANEOUS) ×1 IMPLANT
IV CATH 14GX2 1/4 (CATHETERS) ×1 IMPLANT
KIT BASIN OR (CUSTOM PROCEDURE TRAY) ×1 IMPLANT
KIT TURNOVER KIT A (KITS) IMPLANT
PAD POSITIONING PINK XL (MISCELLANEOUS) IMPLANT
PENCIL SMOKE EVACUATOR (MISCELLANEOUS) IMPLANT
PROTECTOR NERVE ULNAR (MISCELLANEOUS) IMPLANT
SCISSORS LAP 5X35 DISP (ENDOMECHANICALS) ×1 IMPLANT
SET TUBE SMOKE EVAC HIGH FLOW (TUBING) IMPLANT
SLEEVE Z-THREAD 5X100MM (TROCAR) ×2 IMPLANT
SPIKE FLUID TRANSFER (MISCELLANEOUS) ×1 IMPLANT
SUT MNCRL AB 4-0 PS2 18 (SUTURE) ×1 IMPLANT
SYS BAG RETRIEVAL 10MM (BASKET)
SYSTEM BAG RETRIEVAL 10MM (BASKET) IMPLANT
TAPE CLOTH 4X10 WHT NS (GAUZE/BANDAGES/DRESSINGS) IMPLANT
TOWEL OR 17X26 10 PK STRL BLUE (TOWEL DISPOSABLE) ×1 IMPLANT
TRAY LAPAROSCOPIC (CUSTOM PROCEDURE TRAY) ×1 IMPLANT
TROCAR BALLN 12MMX100 BLUNT (TROCAR) ×1 IMPLANT
TROCAR Z-THREAD OPTICAL 5X100M (TROCAR) ×1 IMPLANT

## 2022-10-29 NOTE — Anesthesia Postprocedure Evaluation (Signed)
Anesthesia Post Note  Patient: Hailey Holmes  Procedure(s) Performed: LAPAROSCOPIC CHOLECYSTECTOMY (Abdomen)     Patient location during evaluation: PACU Anesthesia Type: General Level of consciousness: awake and alert Pain management: pain level controlled Vital Signs Assessment: post-procedure vital signs reviewed and stable Respiratory status: spontaneous breathing, nonlabored ventilation, respiratory function stable and patient connected to nasal cannula oxygen Cardiovascular status: blood pressure returned to baseline and stable Postop Assessment: no apparent nausea or vomiting Anesthetic complications: no  No notable events documented.  Last Vitals:  Vitals:   10/29/22 1316 10/29/22 1327  BP: 134/75 119/71  Pulse: 88   Resp: 17   Temp:    SpO2: 98%     Last Pain:  Vitals:   10/29/22 1327  TempSrc:   PainSc: 0-No pain                 Janica Eldred,W. EDMOND

## 2022-10-29 NOTE — Op Note (Signed)
10/28/2022 - 10/29/2022  12:37 PM  PATIENT:  Hailey CharonMartha Holmes  71 y.o. female  PRE-OPERATIVE DIAGNOSIS:  acute cholecysitis with cholelithiasis  POST-OPERATIVE DIAGNOSIS:  acute cholecysitis with cholelithiasis  PROCEDURE:  Procedure(s): LAPAROSCOPIC CHOLECYSTECTOMY (N/A)  SURGEON:  Surgeon(s) and Role:    * Griselda Mineroth, Zyden Suman III, MD - Primary  PHYSICIAN ASSISTANT:   ASSISTANTS: none   ANESTHESIA:   local and general  EBL:  20 mL   BLOOD ADMINISTERED:none  DRAINS: none   LOCAL MEDICATIONS USED:  MARCAINE     SPECIMEN:  Source of Specimen:  gallbladder  DISPOSITION OF SPECIMEN:  PATHOLOGY  COUNTS:  YES  TOURNIQUET:  * No tourniquets in log *  DICTATION: .Dragon Dictation    Procedure: After informed consent was obtained the patient was brought to the operating room and placed in the supine position on the operating room table. After adequate induction of general anesthesia the patient's abdomen was prepped with ChloraPrep allowed to dry and draped in usual sterile manner. An appropriate timeout was performed. The area below the umbilicus was infiltrated with quarter percent  Marcaine. A small incision was made with a 15 blade knife. The incision was carried down through the subcutaneous tissue bluntly with a hemostat and [employer] retractors. The linea alba was identified. The linea alba was incised with a 15 blade knife and each side was grasped with Coker clamps. The preperitoneal space was then probed with a hemostat until the peritoneum was opened and access was gained to the abdominal cavity. A 0 Vicryl pursestring stitch was placed in the fascia surrounding the opening. A Hassan cannula was then placed through the opening and anchored in place with the previously placed Vicryl purse string stitch. The abdomen was insufflated with carbon dioxide without difficulty. A lapar[facility] was [facility] in the right upper quadrant was inspected. Next the epigastric  region was infiltrated with % Marcaine. A small incision was made with a 15 blade knife. A 5 mm port was placed bluntly through this incision into the abdominal cavity under direct vision. Next 2 sites were chosen laterally on the right side of the abdomen for placement of 5 mm ports. Each of these areas was infiltrated with quarter percent Marcaine. Small stab incisions were made with a 15 blade knife. 5 mm ports were then placed bluntly through these incisions into the abdominal cavity under direct vision without difficulty.  The gallbladder was very inflamed and tense and had to be aspirated so that we could grab it.  There were some adhesions to the body of the gallbladder that were taken down bluntly without difficulty.  A blunt grasper was placed through the lateralmost 5 mm port and used to grasp the dome of the gallbladder and elevate it anteriorly and superiorly. Another blunt grasper was placed through the other 5 mm port and used to retract the body and neck of the gallbladder. A dissector was placed through the epigastric port and using the electrocautery the peritoneal reflection at the gallbladder neck was opened. Blunt dissection was then carried out in this area until the gallbladder neck-cystic duct junction was readily identified and a good clinical window was created.  2 clips were placed on the cystic duct proximally and 1 distally and the duct was divided between the 2 sets of clips. Posterior to this the cystic artery was identified and again dissected bluntly in a circumferential manner until a good window  was created. 2 clips were placed proximally and one distally  on the artery and the artery was divided between the 2 sets of clips. Next a laparoscopic hook cautery device was used to separate the gallbladder from the liver bed. Prior to completely detaching the gallbladder from the liver bed the liver bed was inspected and several small bleeding points were coagulated with the  electrocautery until the area was completely hemostatic. The gallbladder was then detached the rest of it from the liver bed without difficulty. A laparoscopic bag was inserted through the hassan port. The laparoscope was moved to the epigastric port. The gallbladder was placed within the bag and the bag was sealed.  The bag with the gallbladder was then removed with the [facility] cannula through the infraumbilical port without difficulty. The fascial defect was then closed with the previously placed Vicryl pursestring stitch as well as with another figure-of-eight 0 Vicryl stitch. The liver bed was inspected again and found to be hemostatic. The abdomen was irrigated with copious amounts of saline until the effluent was clear. The ports were then removed under direct vision without difficulty and were found to be hemostatic. The gas was allowed to escape. No other abnormalities were noted on general inspection of the abdomen. The skin incisions were all closed with interrupted 4-0 Monocryl subcuticular stitches. Dermabond dressings were applied. The patient tolerated the procedure well. At the end of the case all needle sponge and instrument counts were correct. The patient was then awakened and taken to recovery in stable condition  PLAN OF CARE: Admit for overnight observation  PATIENT DISPOSITION:  PACU - hemodynamically stable.   Delay start of Pharmacological VTE agent (>24hrs) due to surgical blood loss or risk of bleeding: no

## 2022-10-29 NOTE — Progress Notes (Signed)
Day of Surgery   Subjective/Chief Complaint: Complains of RUQ pain   Objective: Vital signs in last 24 hours: Temp:  [97.9 F (36.6 C)-99 F (37.2 C)] 97.9 F (36.6 C) (12/16 0559) Pulse Rate:  [84-102] 84 (12/16 0559) Resp:  [16-20] 20 (12/16 0559) BP: (107-142)/(67-87) 119/73 (12/16 0559) SpO2:  [94 %-98 %] 95 % (12/16 0559) Weight:  [104.3 kg] 104.3 kg (12/15 0958) Last BM Date : 10/28/22  Intake/Output from previous day: 12/15 0701 - 12/16 0700 In: 1596.3 [P.O.:360; I.V.:1186.8; IV Piggyback:49.5] Out: 0  Intake/Output this shift: No intake/output data recorded.  General appearance: alert and cooperative Resp: clear to auscultation bilaterally Cardio: regular rate and rhythm GI: soft, moderate RUQ pain  Lab Results:  Recent Labs    10/28/22 1004  WBC 9.5  HGB 14.1  HCT 42.8  PLT 256   BMET Recent Labs    10/28/22 1004 10/29/22 0544  NA 135 139  K 3.6 3.7  CL 103 109  CO2 23 24  GLUCOSE 185* 103*  BUN 10 8  CREATININE 0.82 0.58  CALCIUM 9.1 8.4*   PT/INR No results for input(s): "LABPROT", "INR" in the last 72 hours. ABG No results for input(s): "PHART", "HCO3" in the last 72 hours.  Invalid input(s): "PCO2", "PO2"  Studies/Results: US Abdomen Limited RUQ (LIVER/GB)  Result Date: 10/28/2022 CLINICAL DATA:  Right upper quadrant abdominal pain, nausea and vomiting for 1 day. EXAM: ULTRASOUND ABDOMEN LIMITED RIGHT UPPER QUADRANT COMPARISON:  Abdominopelvic CT 08/18/2018. FINDINGS: Gallbladder: Previously demonstrated large peripherally calcified gallstone is in the gallbladder neck. There is new gallbladder wall thickening with pericholecystic fluid and a positive sonographic Murphy sign. Common bile duct: Diameter: Upper limits of normal for age, 7 mm. No intrahepatic biliary dilatation or choledocholithiasis identified. Liver: Multiple hepatic cysts are seen which measure up to 3.8 x 3.0 x 4.1 cm in the left lobe. No suspicious liver lesions. Mildly  increased hepatic echogenicity suggesting steatosis. Portal vein is patent on color Doppler imaging with normal direction of blood flow towards the liver. Other: No ascites. IMPRESSION: 1. Cholelithiasis with gallbladder wall thickening, pericholecystic fluid and positive sonographic Murphy sign. Findings are suspicious for acute cholecystitis. 2. Common bile duct at the upper limits of normal for age. 3. Benign-appearing hepatic cysts as seen on previous CT. 4. Probable hepatic steatosis. Electronically Signed   By: Carey Bullocks M.D.   On: 10/28/2022 11:07    Anti-infectives: Anti-infectives (From admission, onward)    Start     Dose/Rate Route Frequency Ordered Stop   10/29/22 0700  cefTRIAXone (ROCEPHIN) 2 g in sodium chloride 0.9 % 100 mL IVPB        2 g 200 mL/hr over 30 Minutes Intravenous Every 24 hours 10/28/22 1153 11/05/22 0659   10/28/22 1130  cefTRIAXone (ROCEPHIN) 2 g in sodium chloride 0.9 % 100 mL IVPB        2 g 200 mL/hr over 30 Minutes Intravenous  Once 10/28/22 1120 10/28/22 1203       Assessment/Plan: s/p Procedure(s): LAPAROSCOPIC CHOLECYSTECTOMY (N/A) Plan for lap chole today. Risks and benefits of the surgery as well as some of the technical aspects including cbd injury discussed with pt and she understands and wishes to proceed npo  LOS: 0 days    Chevis Pretty III 10/29/2022

## 2022-10-29 NOTE — Anesthesia Preprocedure Evaluation (Addendum)
Anesthesia Evaluation  Patient identified by MRN, date of birth, ID band Patient awake    Reviewed: Allergy & Precautions, H&P , NPO status , Patient's Chart, lab work & pertinent test results  Airway Mallampati: II  TM Distance: >3 FB Neck ROM: Full    Dental no notable dental hx. (+) Teeth Intact, Dental Advisory Given   Pulmonary asthma    Pulmonary exam normal breath sounds clear to auscultation       Cardiovascular negative cardio ROS  Rhythm:Regular Rate:Normal     Neuro/Psych negative neurological ROS  negative psych ROS   GI/Hepatic negative GI ROS, Neg liver ROS,,,  Endo/Other  Hypothyroidism  Morbid obesity  Renal/GU negative Renal ROS  negative genitourinary   Musculoskeletal  (+) Arthritis , Osteoarthritis,    Abdominal   Peds  Hematology negative hematology ROS (+)   Anesthesia Other Findings   Reproductive/Obstetrics negative OB ROS                             Anesthesia Physical Anesthesia Plan  ASA: 2  Anesthesia Plan: General   Post-op Pain Management: Ofirmev IV (intra-op)*   Induction: Intravenous  PONV Risk Score and Plan: 4 or greater and Ondansetron, Dexamethasone and Treatment may vary due to age or medical condition  Airway Management Planned: Oral ETT  Additional Equipment:   Intra-op Plan:   Post-operative Plan: Extubation in OR  Informed Consent: I have reviewed the patients History and Physical, chart, labs and discussed the procedure including the risks, benefits and alternatives for the proposed anesthesia with the patient or authorized representative who has indicated his/her understanding and acceptance.     Dental advisory given  Plan Discussed with: CRNA  Anesthesia Plan Comments:        Anesthesia Quick Evaluation

## 2022-10-29 NOTE — Transfer of Care (Signed)
Immediate Anesthesia Transfer of Care Note  Patient: Mikael Debell  Procedure(s) Performed: LAPAROSCOPIC CHOLECYSTECTOMY (Abdomen)  Patient Location: PACU  Anesthesia Type:General  Level of Consciousness: drowsy and patient cooperative  Airway & Oxygen Therapy: Patient Spontanous Breathing and Patient connected to face mask oxygen  Post-op Assessment: Report given to RN and Post -op Vital signs reviewed and stable  Post vital signs: Reviewed and stable  Last Vitals:  Vitals Value Taken Time  BP 127/66 10/29/22 1245  Temp 36.4 C 10/29/22 1245  Pulse 82 10/29/22 1247  Resp 16 10/29/22 1247  SpO2 96 % 10/29/22 1247  Vitals shown include unvalidated device data.  Last Pain:  Vitals:   10/29/22 1245  TempSrc:   PainSc: Asleep         Complications: No notable events documented.

## 2022-10-29 NOTE — Anesthesia Procedure Notes (Signed)
Procedure Name: Intubation Date/Time: 10/29/2022 11:35 AM  Performed by: Raenette Rover, CRNAPre-anesthesia Checklist: Patient identified, Emergency Drugs available, Suction available and Patient being monitored Patient Re-evaluated:Patient Re-evaluated prior to induction Oxygen Delivery Method: Circle system utilized Preoxygenation: Pre-oxygenation with 100% oxygen Induction Type: IV induction Ventilation: Mask ventilation without difficulty Laryngoscope Size: Mac and 3 Grade View: Grade I Tube type: Oral Tube size: 7.0 mm Number of attempts: 1 Airway Equipment and Method: Stylet Placement Confirmation: ETT inserted through vocal cords under direct vision, positive ETCO2 and breath sounds checked- equal and bilateral Secured at: 21 cm Tube secured with: Tape Dental Injury: Teeth and Oropharynx as per pre-operative assessment

## 2022-10-30 ENCOUNTER — Encounter (HOSPITAL_COMMUNITY): Payer: Self-pay | Admitting: General Surgery

## 2022-10-30 LAB — GLUCOSE, CAPILLARY: Glucose-Capillary: 124 mg/dL — ABNORMAL HIGH (ref 70–99)

## 2022-10-30 MED ORDER — TRAMADOL HCL 50 MG PO TABS: 50.0000 mg | ORAL_TABLET | Freq: Four times a day (QID) | ORAL | 0 refills | Status: AC | PRN

## 2022-10-30 NOTE — Progress Notes (Signed)
  Transition of Care [facility]) Screening Note   Patient Details  Name: Hailey Holmes Date of Birth: 1951/02/27   Transition of Care [facility]) CM/SW Contact:    Adrian Prows, RN Phone Number: [phone] 10/30/2022, 10:01 AM    Transition of Care Department [facility]) has reviewed patient and no TOC needs have been identified at this time. We will continue to monitor patient advancement through interdisciplinary progression rounds. If new patient transition needs arise, please place a TOC consult.

## 2022-10-30 NOTE — Discharge Summary (Signed)
Physician Discharge Summary  Patient ID: Hailey Holmes MRN: [id number] DOB/AGE: 73-Jun-1952 73 y.o.  Admit date: 10/28/2022 Discharge date: 10/30/2022  Admission Diagnoses:  Discharge Diagnoses:  Principal Problem:   Cholecystitis   Discharged Condition: good  Hospital Course: the pt underwent lap chole. She tolerated surgery well. On pod 1 she was ready for d/c home  Consults: None  Significant Diagnostic Studies: none  Treatments: surgery: as above  Discharge Exam: Blood pressure 126/67, pulse 77, temperature 97.9 F (36.6 C), temperature source Oral, resp. rate 18, height 5\' 4"  (1.626 m), weight 104.3 kg, SpO2 95 %. GI: soft, mild tenderness  Disposition: Discharge disposition: 01-Home or Self Care       Discharge Instructions     Call MD for:  difficulty breathing, headache or visual disturbances   Complete by: As directed    Call MD for:  extreme fatigue   Complete by: As directed    Call MD for:  hives   Complete by: As directed    Call MD for:  persistant dizziness or light-headedness   Complete by: As directed    Call MD for:  persistant nausea and vomiting   Complete by: As directed    Call MD for:  redness, tenderness, or signs of infection (pain, swelling, redness, odor or green/yellow discharge around incision site)   Complete by: As directed    Call MD for:  severe uncontrolled pain   Complete by: As directed    Call MD for:  temperature >100.4   Complete by: As directed    Diet - low sodium heart healthy   Complete by: As directed    Discharge instructions   Complete by: As directed    May shower. Low fat diet. No heavy lifting   Increase activity slowly   Complete by: As directed    No wound care   Complete by: As directed       Allergies as of 10/30/2022       Reactions   Augmentin [amoxicillin-pot Clavulanate] Diarrhea   Codeine Nausea Only   Dizziness   Penicillins Diarrhea   Has patient had a PCN reaction causing immediate  rash, facial/tongue/throat swelling, SOB or lightheadedness with hypotension: No Has patient had a PCN reaction causing severe rash involving mucus membranes or skin necrosis: No Has patient had a PCN reaction that required hospitalization: No Has patient had a PCN reaction occurring within the last 10 years: Yes If all of the above answers are "NO", then may proceed with Cephalosporin use.   Erythromycin Rash        Medication List     TAKE these medications    Bilberry 1000 MG Caps Take 1,000 mg by mouth daily.   BIOFREEZE EX Apply 1 application topically as needed (muscle pain).   CALCIUM-MAGNESIUM PO Take 1-2 tablets by mouth See admin instructions. Take 1 tablet in the morning and 2 tablets in the evening   Collagen Ultra Caps Take 1 capsule by mouth 2 (two) times daily.   esomeprazole 20 MG capsule Commonly known as: NEXIUM Take 20 mg by mouth daily before breakfast.   fexofenadine 180 MG tablet Commonly known as: ALLEGRA Take 180 mg by mouth daily.   FISH OIL PO Take 1,280 mg by mouth 2 (two) times daily.   Ginkgo Biloba 120 MG Caps Take 120 mg by mouth daily.   GINSENG PO Take 550 mg by mouth See admin instructions. Take 550 mg by mouth once daily for 2 weeks out  of every month   GTF Chromium 200 MCG Tabs Generic drug: Chromium Take 1 tablet by mouth daily.   HYALURONIC ACID PO Take 1 capsule by mouth in the morning and at bedtime.   levothyroxine 100 MCG tablet Commonly known as: SYNTHROID Take 100 mcg by mouth daily before breakfast.   Manganese 10 MG Tabs Take 10 mg by mouth every evening.   methocarbamol 500 MG tablet Commonly known as: ROBAXIN Take 1 tablet (500 mg total) by mouth every 6 (six) hours as needed for muscle spasms.   metroNIDAZOLE 500 MG tablet Commonly known as: FLAGYL Take 1 tablet (500 mg total) by mouth every 8 (eight) hours.   multivitamin with minerals Tabs tablet Take 1 tablet by mouth daily.   OVER THE COUNTER  MEDICATION Take 1 tablet by mouth daily. Leg Veins otc supplement   OVER THE COUNTER MEDICATION Take 1 tablet by mouth 2 (two) times daily. Flexiaid otc supplement   OVER THE COUNTER MEDICATION Take 1 tablet by mouth every evening. Bladder Support otc supplement   OVER THE COUNTER MEDICATION Place 5 tablets under the tongue daily. aesculus hippocastanum pellets   OVER THE COUNTER MEDICATION Take 1 tablet by mouth daily. Joint soother   SOY ISOFLAVONES PO Take 60 mg by mouth every evening.   TART CHERRY ADVANCED PO Take 1,200 mg by mouth 2 (two) times daily.   TIGER BALM MUSCLE RUB EX Apply 1 application topically as needed (muscle pain).   traMADol 50 MG tablet Commonly known as: ULTRAM Take 1-2 tablets (50-100 mg total) by mouth every 6 (six) hours as needed for moderate pain or severe pain.   triamcinolone cream 0.1 % Commonly known as: KENALOG Apply 1 Application topically daily. For eczema   VALERIAN PO Take 110 mg by mouth at bedtime.   Vitamin D3 125 MCG (5000 UT) Caps Take 5,000 Units by mouth See admin instructions. Take 5000 units once daily except sundays        Follow-up Information     [facility] Follow up in 2 week(s).   Specialty: General Surgery Contact information: [address] [place] [address] [phone]                Signed: Chevis Pretty III 10/30/2022, 9:02 AM

## 2022-10-30 NOTE — Plan of Care (Signed)

## 2022-10-30 NOTE — Progress Notes (Signed)
1 Day Post-Op   Subjective/Chief Complaint: No complaints. Wants to go home   Objective: Vital signs in last 24 hours: Temp:  [97.5 F (36.4 C)-98.4 F (36.9 C)] 97.9 F (36.6 C) (12/17 0552) Pulse Rate:  [77-101] 77 (12/17 0552) Resp:  [14-20] 18 (12/17 0552) BP: (112-150)/(59-83) 126/67 (12/17 0552) SpO2:  [91 %-98 %] 95 % (12/17 0552) Last BM Date : 10/28/22  Intake/Output from previous day: 12/16 0701 - 12/17 0700 In: 3097 [P.O.:510; I.V.:2436.5; IV Piggyback:150.6] Out: 1370 [Urine:1350; Blood:20] Intake/Output this shift: No intake/output data recorded.  General appearance: alert and cooperative Resp: clear to auscultation bilaterally Cardio: regular rate and rhythm GI: soft, mild tenderness  Lab Results:  Recent Labs    10/28/22 1004  WBC 9.5  HGB 14.1  HCT 42.8  PLT 256   BMET Recent Labs    10/28/22 1004 10/29/22 0544  NA 135 139  K 3.6 3.7  CL 103 109  CO2 23 24  GLUCOSE 185* 103*  BUN 10 8  CREATININE 0.82 0.58  CALCIUM 9.1 8.4*   PT/INR No results for input(s): "LABPROT", "INR" in the last 72 hours. ABG No results for input(s): "PHART", "HCO3" in the last 72 hours.  Invalid input(s): "PCO2", "PO2"  Studies/Results: US Abdomen Limited RUQ (LIVER/GB)  Result Date: 10/28/2022 CLINICAL DATA:  Right upper quadrant abdominal pain, nausea and vomiting for 1 day. EXAM: ULTRASOUND ABDOMEN LIMITED RIGHT UPPER QUADRANT COMPARISON:  Abdominopelvic CT 08/18/2018. FINDINGS: Gallbladder: Previously demonstrated large peripherally calcified gallstone is in the gallbladder neck. There is new gallbladder wall thickening with pericholecystic fluid and a positive sonographic Murphy sign. Common bile duct: Diameter: Upper limits of normal for age, 7 mm. No intrahepatic biliary dilatation or choledocholithiasis identified. Liver: Multiple hepatic cysts are seen which measure up to 3.8 x 3.0 x 4.1 cm in the left lobe. No suspicious liver lesions. Mildly increased  hepatic echogenicity suggesting steatosis. Portal vein is patent on color Doppler imaging with normal direction of blood flow towards the liver. Other: No ascites. IMPRESSION: 1. Cholelithiasis with gallbladder wall thickening, pericholecystic fluid and positive sonographic Murphy sign. Findings are suspicious for acute cholecystitis. 2. Common bile duct at the upper limits of normal for age. 3. Benign-appearing hepatic cysts as seen on previous CT. 4. Probable hepatic steatosis. Electronically Signed   By: Carey Bullocks M.D.   On: 10/28/2022 11:07    Anti-infectives: Anti-infectives (From admission, onward)    Start     Dose/Rate Route Frequency Ordered Stop   10/29/22 0700  cefTRIAXone (ROCEPHIN) 2 g in sodium chloride 0.9 % 100 mL IVPB        2 g 200 mL/hr over 30 Minutes Intravenous Every 24 hours 10/28/22 1153 11/05/22 0659   10/28/22 1130  cefTRIAXone (ROCEPHIN) 2 g in sodium chloride 0.9 % 100 mL IVPB        2 g 200 mL/hr over 30 Minutes Intravenous  Once 10/28/22 1120 10/28/22 1203       Assessment/Plan: s/p Procedure(s): LAPAROSCOPIC CHOLECYSTECTOMY (N/A) Advance diet D/c today  LOS: 0 days    Hailey Holmes 10/30/2022

## 2022-10-30 NOTE — Progress Notes (Signed)
Assessment unchanged. Pt verbalized understanding of dc instructions including medications, follow up care, and when to call the doctor. Discharged via wc to fornt entrance by NT.

## 2022-11-02 LAB — SURGICAL PATHOLOGY

## 2024-09-21 LAB — COLOGUARD: COLOGUARD: NEGATIVE

## 2025-01-13 ENCOUNTER — Encounter: Admitting: Obstetrics and Gynecology
